# Patient Record
Sex: Female | Born: 1985 | Race: Asian | Hispanic: No | Marital: Married | Smoking: Never smoker
Health system: Southern US, Community
[De-identification: ages and names within clinical notes are randomized; demographics above are authoritative.]

---

## 2004-05-26 ENCOUNTER — Emergency Department (HOSPITAL_COMMUNITY): Admission: EM | Admit: 2004-05-26 | Discharge: 2004-05-27 | Payer: Self-pay | Admitting: Emergency Medicine

## 2005-09-06 ENCOUNTER — Encounter: Admission: RE | Admit: 2005-09-06 | Discharge: 2005-09-06 | Payer: Self-pay | Admitting: Occupational Medicine

## 2006-06-14 ENCOUNTER — Encounter: Admission: RE | Admit: 2006-06-14 | Discharge: 2006-06-14 | Payer: Self-pay | Admitting: Family Medicine

## 2008-03-06 ENCOUNTER — Ambulatory Visit (HOSPITAL_COMMUNITY): Admission: RE | Admit: 2008-03-06 | Discharge: 2008-03-06 | Payer: Self-pay | Admitting: Emergency Medicine

## 2009-08-06 ENCOUNTER — Inpatient Hospital Stay (HOSPITAL_COMMUNITY): Admission: AD | Admit: 2009-08-06 | Discharge: 2009-08-08 | Payer: Self-pay | Admitting: Obstetrics

## 2009-08-09 ENCOUNTER — Encounter: Admission: RE | Admit: 2009-08-09 | Discharge: 2009-08-23 | Payer: Self-pay | Admitting: Obstetrics

## 2009-11-16 ENCOUNTER — Encounter: Admission: RE | Admit: 2009-11-16 | Discharge: 2009-11-24 | Payer: Self-pay | Admitting: Obstetrics

## 2010-01-10 IMAGING — US US PELVIS COMPLETE MODIFY
1 series · 14 of 25 positions shown · non-contrast
Comparison: None

CLINICAL DATA: Back pain for 2 months with intermittent abdominal
pain for 1 month and LMP 02/25/2008

TRANSABDOMINAL AND TRANSVAGINAL ULTRASOUND OF PELVIS
TECHNIQUE: Both transabdominal and transvaginal ultrasound
examinations of the pelvis were performed including evaluation of
the uterus, ovaries, adnexal regions, and pelvic cul-de-sac.

[Series 1: us transvaginal non-ob · 14 of 38 slices shown]
[im 1/38]
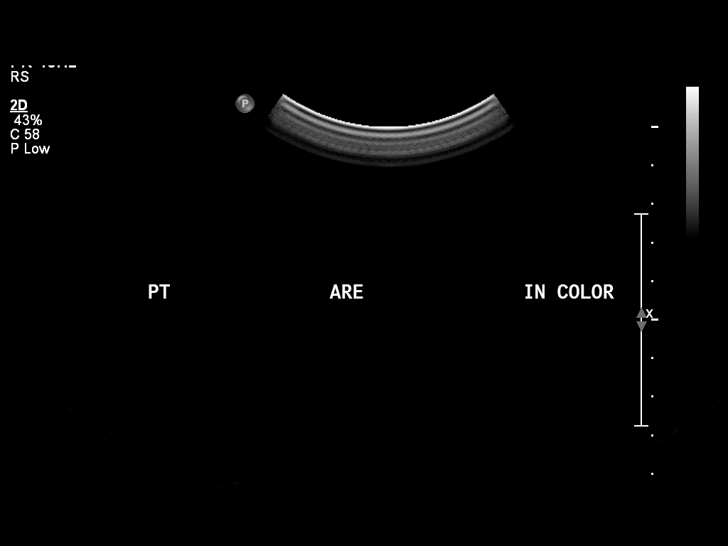
[im 4/38]
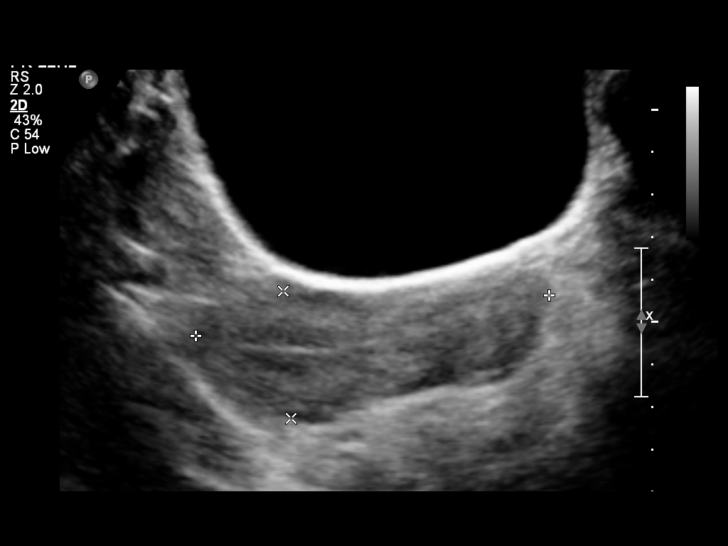
[im 7/38]
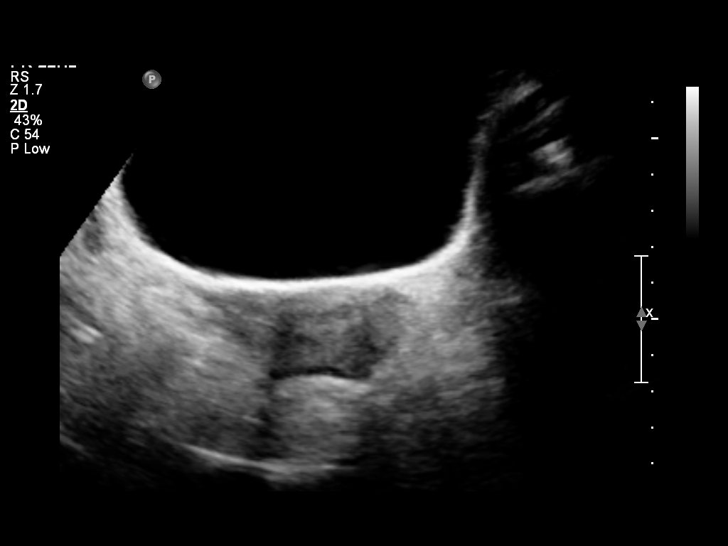
[im 10/38]
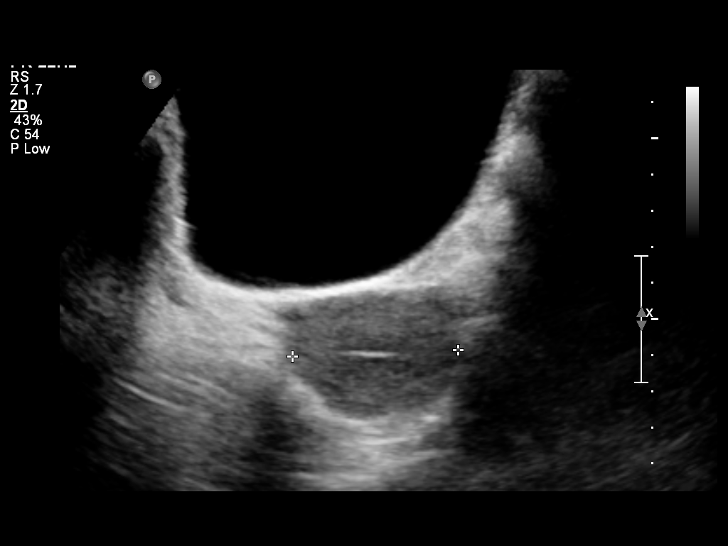
[im 13/38]
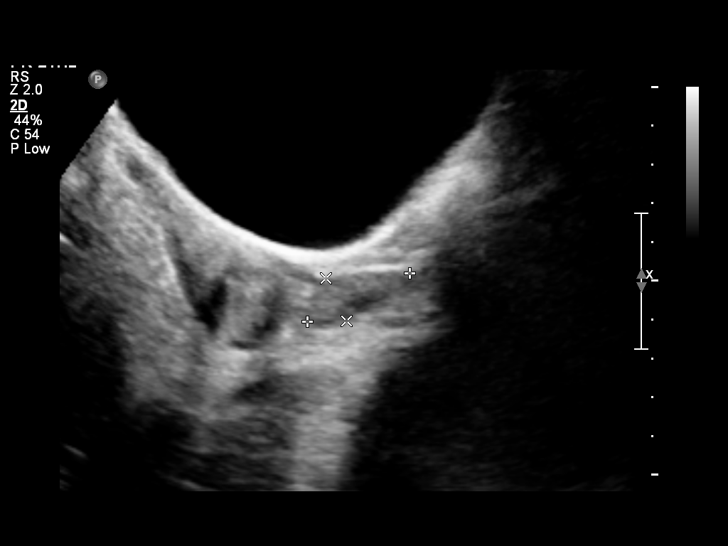
[im 14/38]
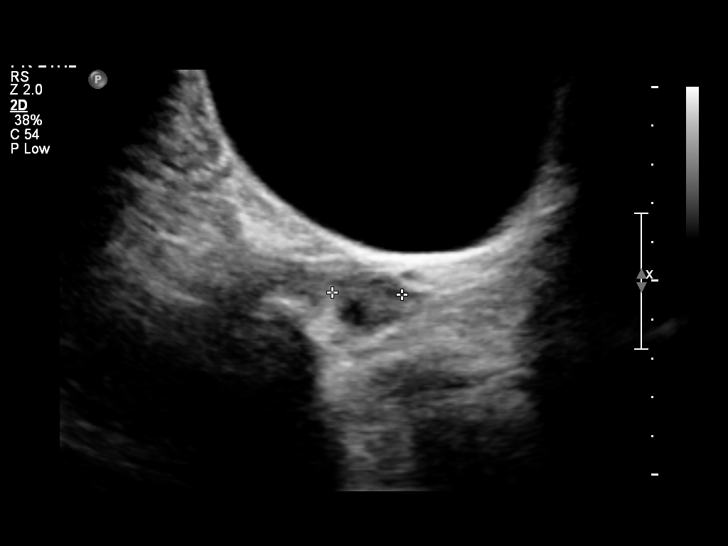
[im 17/38]
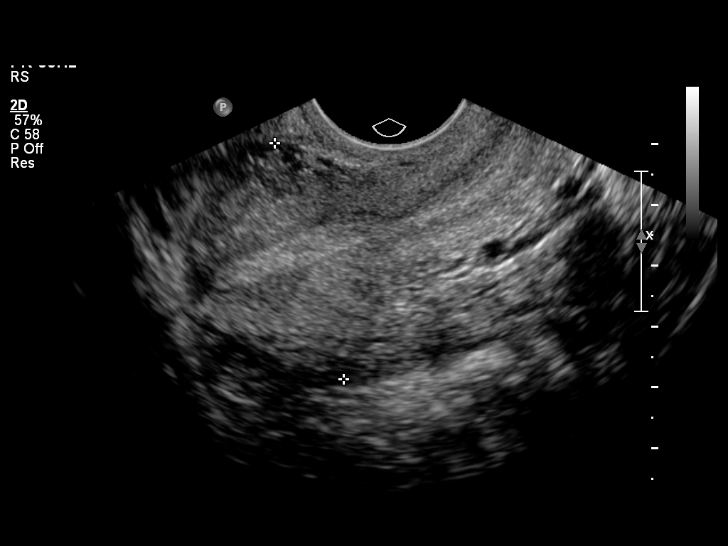
[im 21/38]
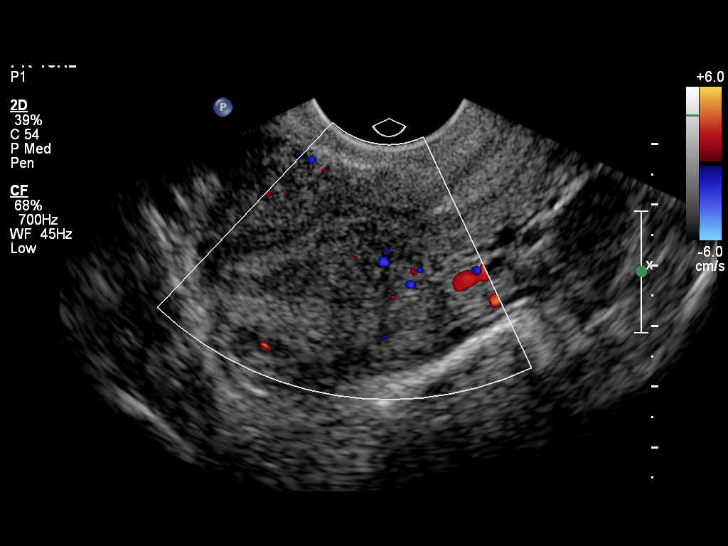
[im 24/38]
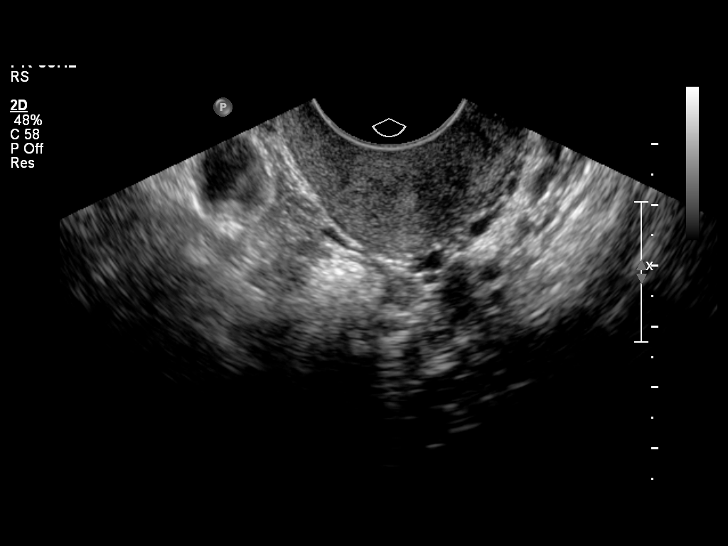
[im 25/38]
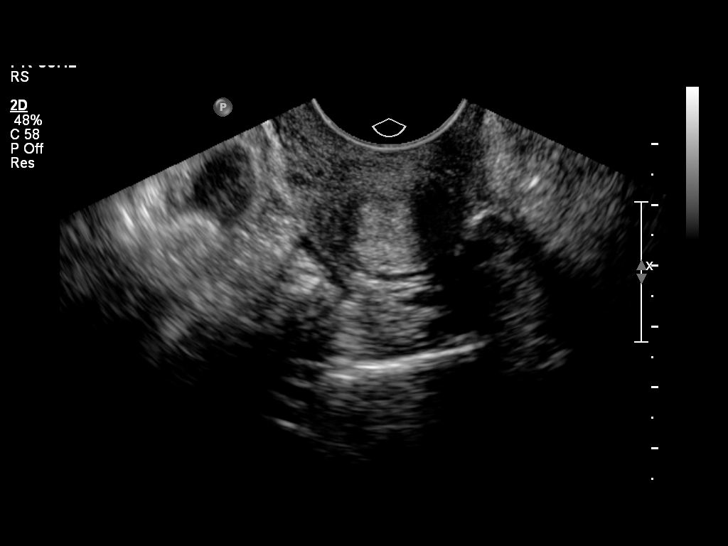
[im 28/38]
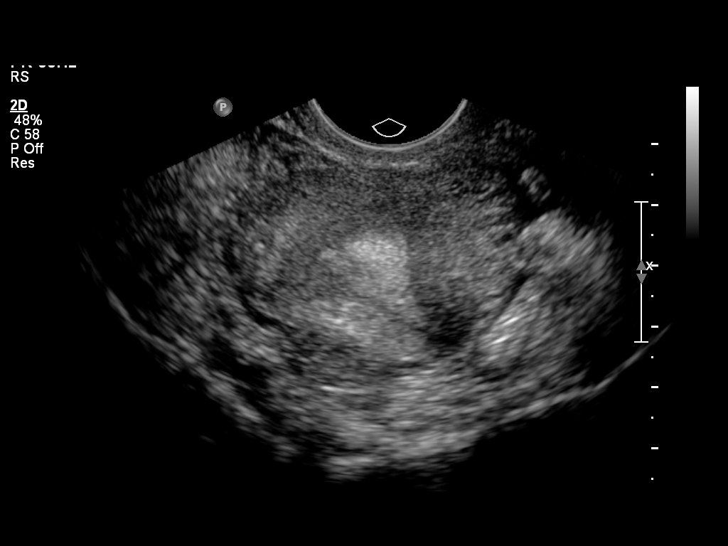
[im 31/38]
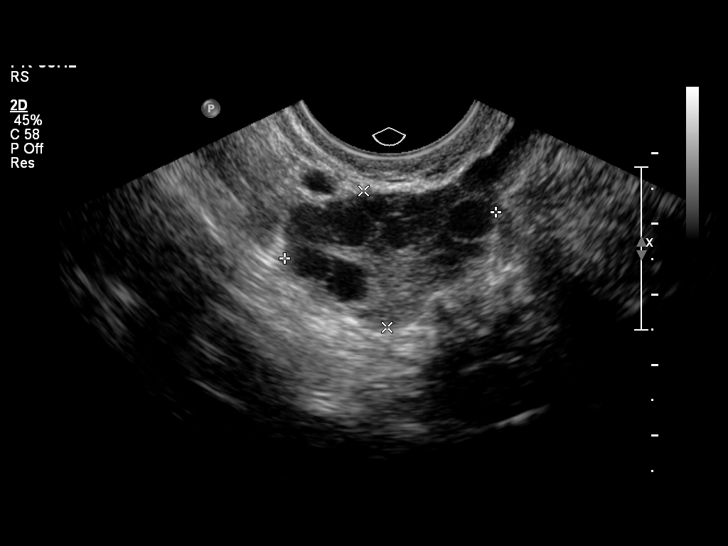
[im 34/38]
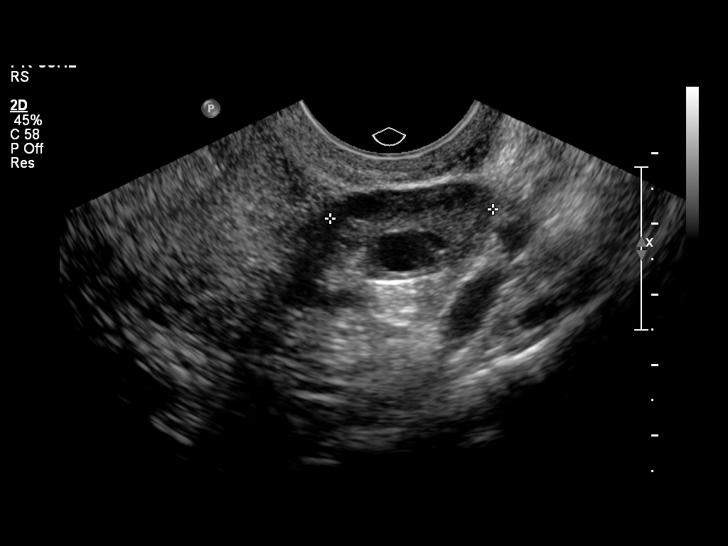
[im 38/38]
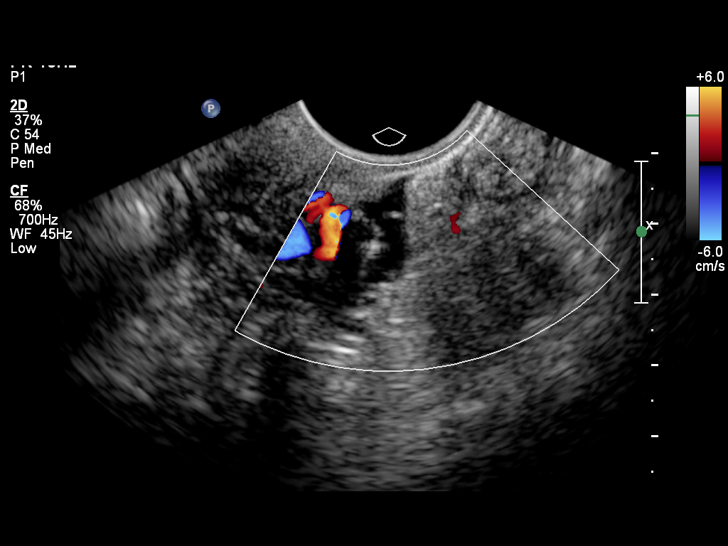

[14 of 25 positions shown; findings below may reference images not displayed]

FINDINGS: The uterus has a sagittal length of 8.3 cm, an AP width
of 3.0 cm and a transverse width of 4.6 cm.  A homogeneous uterine
myometrium is seen.  The endometrial lining is echogenic with an AP
width of 6 mm.  No areas of focal thickening or inhomogeneity are
noted.

Both ovaries have a normal appearance with the right ovary
measuring 1.9 x 3.1 x 2.0 cm and the left ovary measuring 3.1 x
x 2.3 cm.  No cul-de-sac or periovarian fluid is seen and no
separate adnexal masses are noted.
IMPRESSION: Normal pelvic ultrasound.

## 2010-06-01 LAB — CBC
HCT: 36.4 % (ref 36.0–46.0)
Hemoglobin: 12.7 g/dL (ref 12.0–15.0)
MCHC: 34.8 g/dL (ref 30.0–36.0)
MCV: 90 fL (ref 78.0–100.0)
Platelets: 206 10*3/uL (ref 150–400)
RDW: 15.7 % — ABNORMAL HIGH (ref 11.5–15.5)
WBC: 12.5 10*3/uL — ABNORMAL HIGH (ref 4.0–10.5)

## 2010-06-01 LAB — RPR: RPR Ser Ql: NONREACTIVE

## 2011-06-12 IMAGING — US US OB LIMITED
1 series · 14 of 17 positions shown · non-contrast
Comparison: none

OBSTETRICAL ULTRASOUND:
 This ultrasound exam was performed in the [HOSPITAL] Ultrasound Department.  The OB US report was generated in the AS system, and faxed to the ordering physician.  This report is also available in [HOSPITAL]?s Access[employer]ware and in [REDACTED] PACS.

[Series 1: us ob follow up · 0.25mm/px · 14 of 17 slices shown]
[im 1/17]
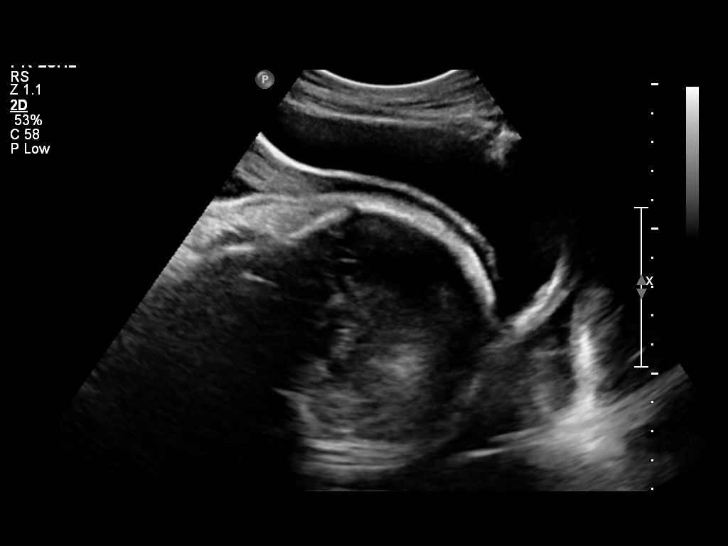
[im 2/17]
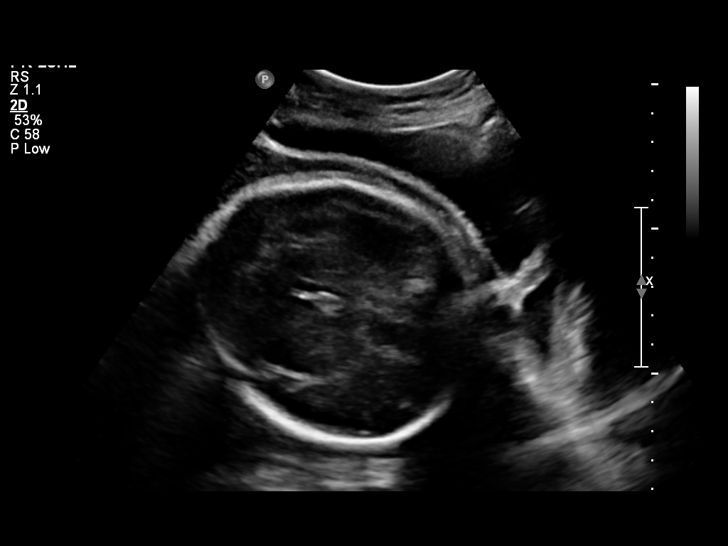
[im 4/17]
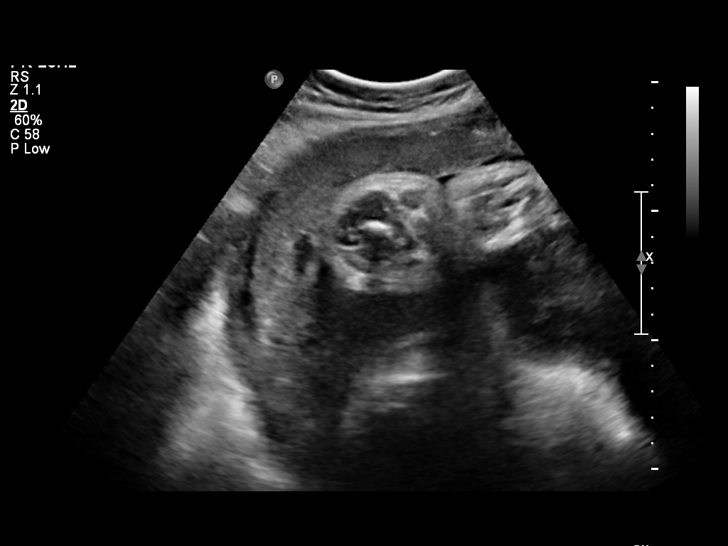
[im 5/17]
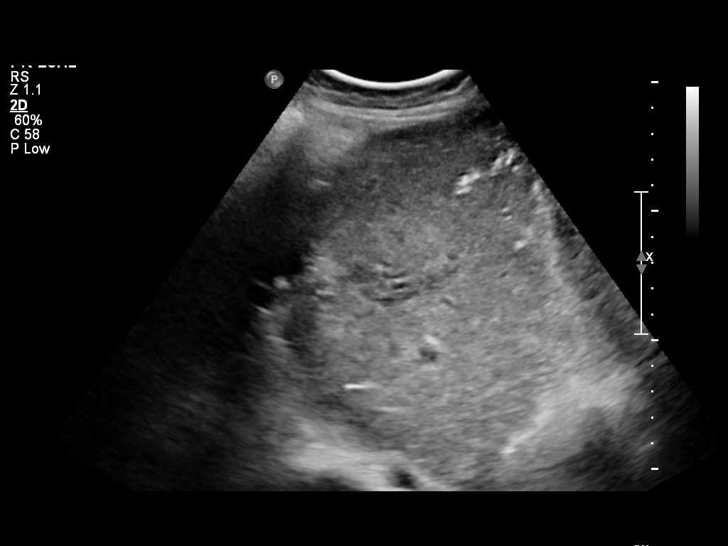
[im 6/17]
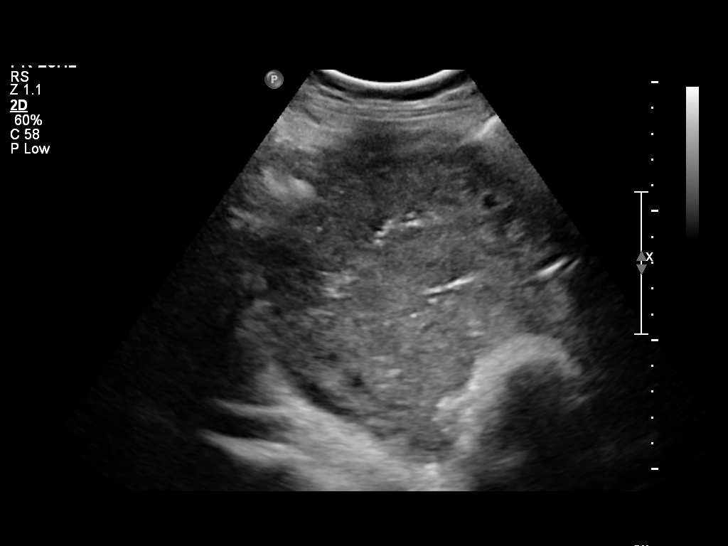
[im 7/17]
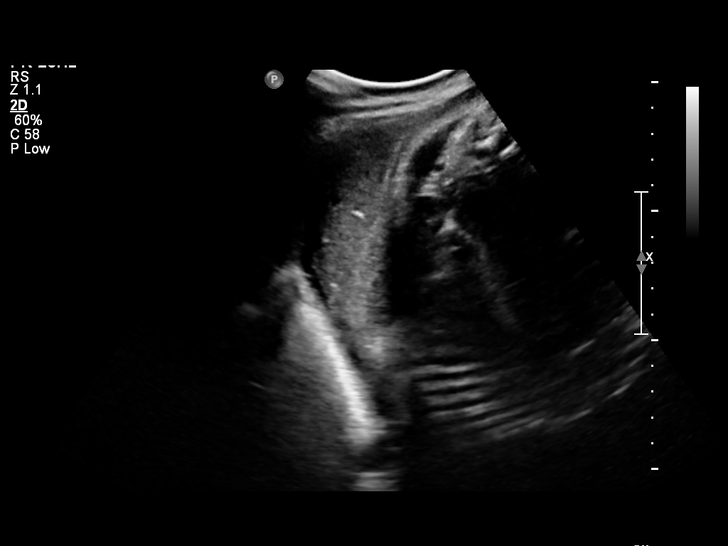
[im 8/17]
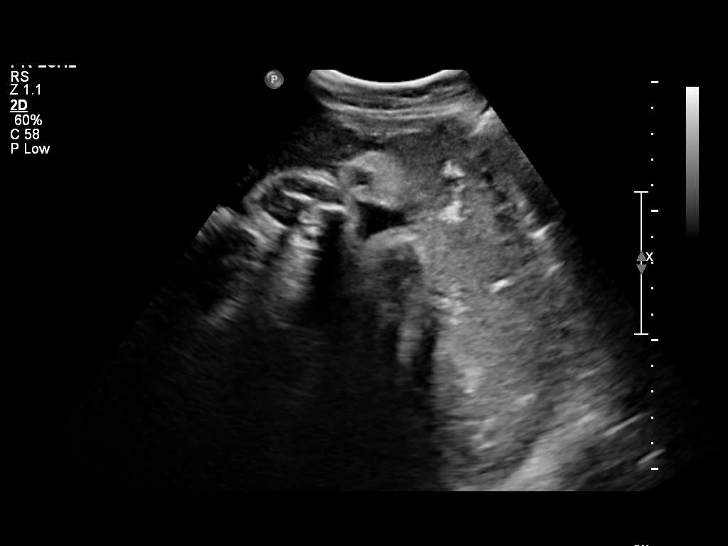
[im 10/17]
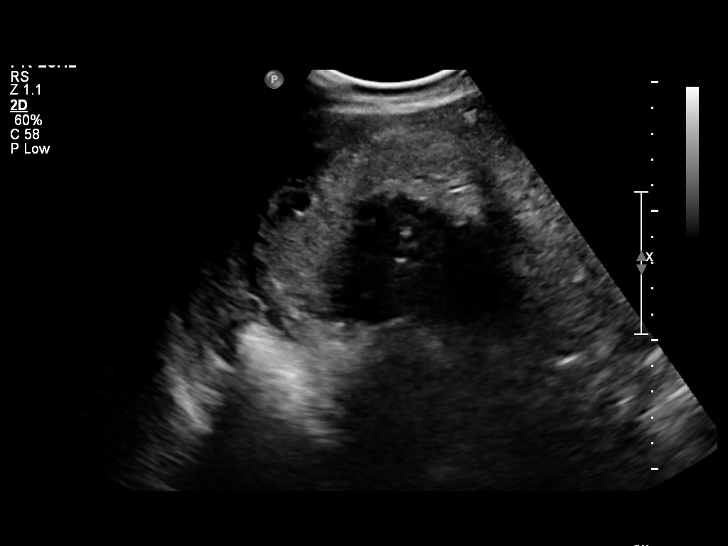
[im 11/17]
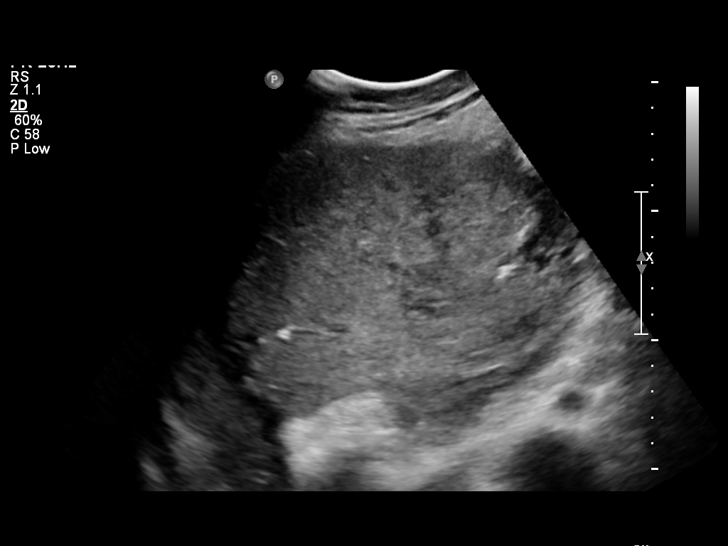
[im 12/17]
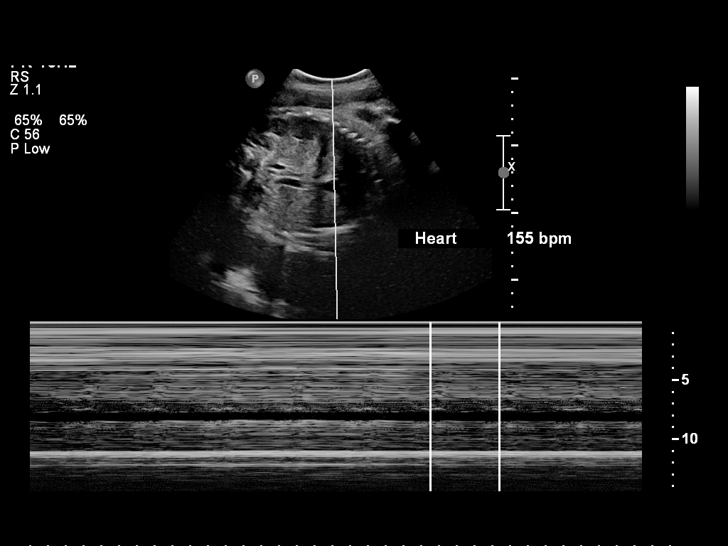
[im 13/17]
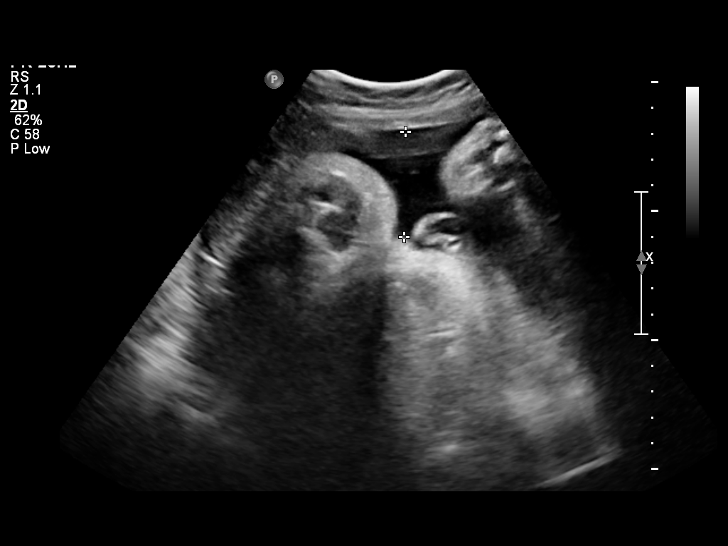
[im 14/17]
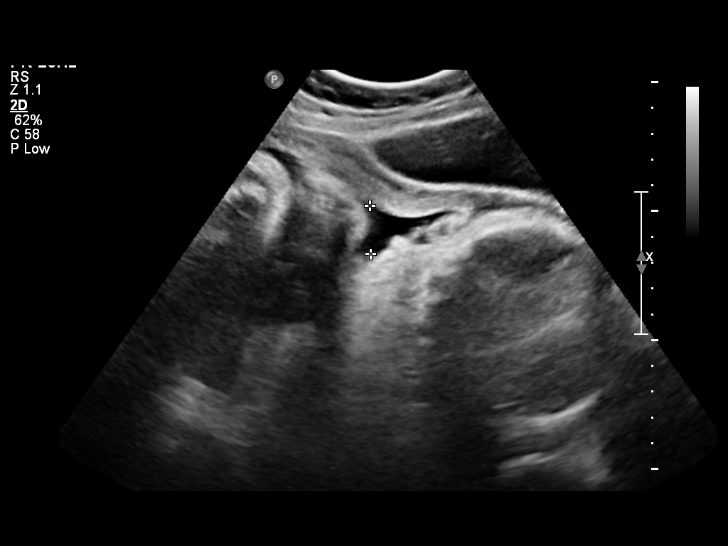
[im 16/17]
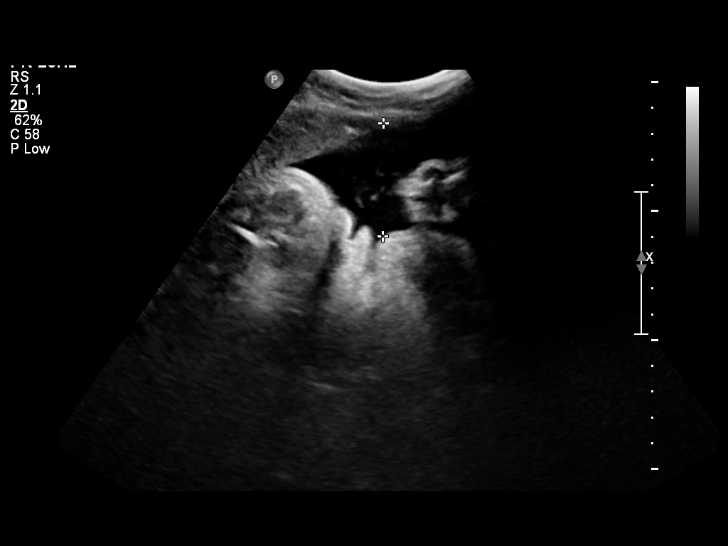
[im 17/17]
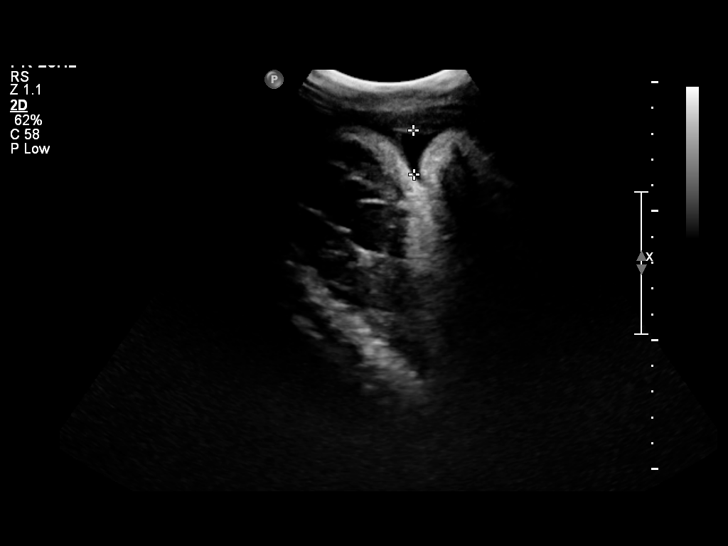

[14 of 17 positions shown; findings below may reference images not displayed]

IMPRESSION: See AS Obstetric US report.

## 2013-08-31 ENCOUNTER — Encounter: Payer: Self-pay | Admitting: Gynecology

## 2013-09-25 ENCOUNTER — Encounter: Payer: Self-pay | Admitting: Physician Assistant

## 2013-09-25 ENCOUNTER — Ambulatory Visit (INDEPENDENT_AMBULATORY_CARE_PROVIDER_SITE_OTHER): Payer: BC Managed Care – PPO

## 2013-09-25 ENCOUNTER — Ambulatory Visit (INDEPENDENT_AMBULATORY_CARE_PROVIDER_SITE_OTHER): Payer: BC Managed Care – PPO | Admitting: Internal Medicine

## 2013-09-25 VITALS — BP 126/80 | HR 85 | Temp 99.0°F | Resp 17 | Ht 58.5 in | Wt 104.0 lb

## 2013-09-25 DIAGNOSIS — Z111 Encounter for screening for respiratory tuberculosis: Secondary | ICD-10-CM

## 2013-09-25 DIAGNOSIS — Z23 Encounter for immunization: Secondary | ICD-10-CM

## 2013-09-25 DIAGNOSIS — Z0000 Encounter for general adult medical examination without abnormal findings: Secondary | ICD-10-CM

## 2013-09-25 NOTE — Patient Instructions (Signed)

## 2013-09-25 NOTE — Progress Notes (Signed)
Subjective:    Patient ID: Dana Myers, female    DOB: [birth year], 29 y.o.   MRN: [id number]  HPI   Dana Myers is a very pleasant 28 yr old female here for CPE  Complaints:  None LMP:  09/19/13, regular every month Contraception:  none GYN:  Last pap 4 yrs ago normal; appointment Aug 3rd with GYN for pap Sexually active, no concern for STI Dentist:  No dentist Eye doctor:  Contacts and glasses - every year Imm:  Will update today for school Diet:  Varied; limits fast food and junk food; no soda; mostly water to drink Exercise:  Strength training, running; 3x/wk Meds:  none Family history:  Father passed away from MI at 68 Tobacco: none Etoh:  none   [employer] - accounting, 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  Eyes: Negative for visual disturbance.  Respiratory: Negative for cough, shortness of breath and wheezing.   Cardiovascular: Negative for chest pain.  Gastrointestinal: Negative for nausea, vomiting and abdominal pain.  Genitourinary: Negative for dysuria, vaginal bleeding and vaginal discharge.  Musculoskeletal: Negative for arthralgias and myalgias.  Skin: Negative.   Neurological: Negative.        Objective:   Physical Exam  Vitals reviewed. Constitutional: She is oriented to person, place, and time. She appears well-developed and well-nourished. No distress.  HENT:  Head: Normocephalic and atraumatic.  Right Ear: Tympanic membrane and ear canal normal.  Left Ear: Tympanic membrane and ear canal normal.  Mouth/Throat: Uvula is midline, oropharynx is clear and moist and mucous membranes are normal.  Eyes: Conjunctivae are normal. No scleral icterus.  Neck: Neck supple.  Cardiovascular: Normal rate, regular rhythm and normal heart sounds.   Pulmonary/Chest: Effort normal and breath sounds normal. She has no wheezes. She has no rales.  Abdominal: Soft. Bowel sounds are normal. There is no  tenderness.  Lymphadenopathy:    She has no cervical adenopathy.  Neurological: She is alert and oriented to person, place, and time.  Skin: Skin is warm and dry.  Psychiatric: She has a normal mood and affect. Her behavior is normal.    [facility] reading (PRIMARY) by  Dr. 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   Immunization History  Administered Date(s) Administered  . DTaP 05/13/2001, 06/15/2001, 07/17/2001, 08/17/2001, 09/16/2001  . Hepatitis B 04/29/2000, 05/28/2000, 06/30/2000  . IPV 03/24/2002  . MMR 02/20/2002, 09/25/2013  . Tdap 09/25/2013       Assessment & Plan:  Routine general medical examination at a health care facility  Screening for tuberculosis - Plan: DG Chest 1 View  Need for Tdap vaccination - Plan: Tdap vaccine greater than or equal to 7yo IM  Need for MMR vaccine - Plan: MMR vaccine subcutaneous   Dana Myers is a very pleasant 28 yr female here for CPE.  She is in good health and exam is normal.  She has an upcoming appt with GYN so have deferred pap and pelvic today.  To be compliant with school immunizations requirements she needs Tdap and MMR today which were both administered.  Due to hx BCG we have done a 1 view CXR for TB screening - which is negative.  She will not need to repeat CXR for 10 years as long as she is asymptomatic.  PPW completed and copies sent for scanning    E. Natividad Brood  MHS, PA-C [facility] 7/14/20159:37 PM

## 2013-10-08 ENCOUNTER — Encounter: Payer: Self-pay | Admitting: Gynecology

## 2013-10-15 ENCOUNTER — Encounter: Payer: Self-pay | Admitting: Gynecology

## 2013-10-15 ENCOUNTER — Ambulatory Visit (INDEPENDENT_AMBULATORY_CARE_PROVIDER_SITE_OTHER): Payer: BC Managed Care – PPO | Admitting: Gynecology

## 2013-10-15 VITALS — BP 98/60 | HR 80 | Resp 18 | Ht <= 58 in | Wt 105.0 lb

## 2013-10-15 DIAGNOSIS — Z124 Encounter for screening for malignant neoplasm of cervix: Secondary | ICD-10-CM

## 2013-10-15 DIAGNOSIS — Z659 Problem related to unspecified psychosocial circumstances: Secondary | ICD-10-CM

## 2013-10-15 DIAGNOSIS — F4323 Adjustment disorder with mixed anxiety and depressed mood: Secondary | ICD-10-CM

## 2013-10-15 DIAGNOSIS — Z658 Other specified problems related to psychosocial circumstances: Secondary | ICD-10-CM

## 2013-10-15 DIAGNOSIS — Z3009 Encounter for other general counseling and advice on contraception: Secondary | ICD-10-CM

## 2013-10-15 DIAGNOSIS — Z01419 Encounter for gynecological examination (general) (routine) without abnormal findings: Secondary | ICD-10-CM

## 2013-10-15 MED ORDER — PAROXETINE HCL 10 MG PO TABS: 10.0000 mg | ORAL_TABLET | ORAL | Status: DC

## 2013-10-15 NOTE — Progress Notes (Addendum)
28 y.o. Single [place] female   G1P1001 here for annual exam. Pt is  currently sexually active.  She reports  using condoms on a regular basis.  First sexual activity at 28  years old, 1  number of lifetime partners.  Together for [employer]y.  Pt reports cycles are regular but first day can be heavy, changes large pads every 2h, no clots.  Patient's last menstrual period was 09/19/2013.          Sexually active: Yes.    The current method of family planning is condoms majority of the time.    Exercising: Yes.    cardio, squats 3x/wk Last pap: 4 years ago- wnl Alcohol: no Tobacco: no Drugs: no Gardisil: no, completed:   Health Maintenance  Topic Date Due  . Pap Smear  09/06/2002  . Influenza Vaccine  10/13/2013  . Tetanus/tdap  09/26/2023    Family History  Problem Relation Age of Onset  . Heart disease Father   . Throat cancer Maternal Grandfather   . Diabetes Maternal Uncle     Uncle's on mom's side  . Hypertension Father     There are no active problems to display for this patient.   History reviewed. No pertinent past medical history.  History reviewed. No pertinent past surgical history.  Allergies: Review of patient's allergies indicates no known allergies.  No current outpatient prescriptions on file.   No current facility-administered medications for this visit.    ROS: Pertinent items are noted in HPI.  Exam:    LMP 09/19/2013 Weight change: @WEIGHTCHANGE @ Last 3 height recordings:  Ht Readings from Last 3 Encounters:  09/25/13 4' 10.5" (1.486 m)   General appearance: alert, cooperative and appears stated age Head: Normocephalic, without obvious abnormality, atraumatic Neck: no adenopathy, no carotid bruit, no JVD, supple, symmetrical, trachea midline and thyroid not enlarged, symmetric, no tenderness/mass/nodules Lungs: clear to auscultation bilaterally Breasts: normal appearance, no masses or tenderness Heart: regular rate and rhythm, S1, S2 normal, no  murmur, click, rub or gallop Abdomen: soft, non-tender; bowel sounds normal; no masses,  no organomegaly Extremities: extremities normal, atraumatic, no cyanosis or edema Skin: Skin color, texture, turgor normal. No rashes or lesions Lymph nodes: Cervical, supraclavicular, and axillary nodes normal. no inguinal nodes palpated Neurologic: Grossly normal   Pelvic: External genitalia:  no lesions              Urethra: normal appearing urethra with no masses, tenderness or lesions              Bartholins and Skenes: Bartholin's, Urethra, Skene's normal                 Vagina: normal appearing vagina with normal color and discharge, no lesions              Cervix: normal appearance              Pap taken: Yes.          Bimanual Exam:  Uterus:  uterus is normal size, shape, consistency and nontender                                      Adnexa:    normal adnexa in size, nontender and no masses  Rectovaginal: Confirms                                      Anus:  normal sphincter tone, no lesions      1. Routine gynecological examination counseled on breast self exam, mammography screening, adequate intake of calcium and vitamin D, diet and exercise return annually or prn  2. Screening for cervical cancer Guidelines reviewed - PAP with Reflex to HPV (IPS)  3. Other general counseling and advice for contraceptive management Ocp, paraguard, mirena and continued condom use reviewed.  Pros and cons of each including affects on menses - IUD Insertion; Future  4. Other social stressor/anxiety-depression Pt tearful.  Pt is [place] and in [place] for 10y.  Pt is Hindu by birth but converted to Muslim to marry husband-no civil documents.  Pt lives in traditional [place] household with her in laws and her brother and sister in Social worker.  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  Had a  temporary separation from husband due to these issues.  She has support from her family.  Suggest she seek advise from family and her religious leader. Pt requests rx to help.   - PARoxetine (PAXIL) 10 MG tablet; Take 1 tablet (10 mg total) by mouth every morning.  Dispense: 30 tablet; Refill: 12 43m spent counseling re social stressor   An After Visit Summary was printed and given to the patient.

## 2013-10-16 LAB — IPS PAP TEST WITH REFLEX TO HPV

## 2013-10-18 ENCOUNTER — Telehealth: Payer: Self-pay | Admitting: *Deleted

## 2013-10-18 NOTE — Telephone Encounter (Signed)
Message copied by Luisa DagoPHILLIPS, STEPHANIE C on Thu Oct 18, 2013 10:52 AM ------      Message from: Douglass RiversLATHROP, TRACY      Created: Wed Oct 17, 2013  7:00 PM       Inform BV on pap, would suggest she treat if she is considering IUD-tindamax 500mg   4 tab at once repeat next day #8 ------

## 2013-10-18 NOTE — Telephone Encounter (Signed)
I have attempted to contact this patient by phone with the following results: left message to return my call on answering machine (mobile per DPR).[phone] (Mobile)

## 2013-10-19 MED ORDER — TINIDAZOLE 500 MG PO TABS: ORAL_TABLET | ORAL | Status: DC

## 2013-10-19 NOTE — Telephone Encounter (Signed)
Returning a call to Stephanie. °

## 2013-10-19 NOTE — Telephone Encounter (Signed)
Patient returning Stephanie's call.

## 2013-10-19 NOTE — Telephone Encounter (Addendum)
Notified of results and escribed Tindmax to [facility]. Pt would like to proceed with IUD insertion.  Informed pt I would make sure we follow through with scheduling that for her.

## 2013-10-29 ENCOUNTER — Telehealth: Payer: Self-pay | Admitting: Gynecology

## 2013-10-29 NOTE — Telephone Encounter (Signed)
Left message for patient to call back. Need to go over contraception benefits. °

## 2013-11-12 NOTE — Addendum Note (Signed)
Addended by: Douglass Rivers on: 11/12/2013 04:59 PM   Modules accepted: Level of Service

## 2014-01-14 ENCOUNTER — Encounter: Payer: Self-pay | Admitting: Gynecology

## 2014-04-30 ENCOUNTER — Emergency Department (HOSPITAL_BASED_OUTPATIENT_CLINIC_OR_DEPARTMENT_OTHER)
Admission: EM | Admit: 2014-04-30 | Discharge: 2014-05-01 | Disposition: A | Discharge status: Other Acute Inpatient Hospital | Payer: 59 | Attending: Emergency Medicine | Admitting: Emergency Medicine

## 2014-04-30 ENCOUNTER — Encounter (HOSPITAL_BASED_OUTPATIENT_CLINIC_OR_DEPARTMENT_OTHER): Payer: Self-pay

## 2014-04-30 DIAGNOSIS — Z3202 Encounter for pregnancy test, result negative: Secondary | ICD-10-CM | POA: Diagnosis not present

## 2014-04-30 DIAGNOSIS — Z79899 Other long term (current) drug therapy: Secondary | ICD-10-CM | POA: Diagnosis not present

## 2014-04-30 DIAGNOSIS — Z008 Encounter for other general examination: Secondary | ICD-10-CM

## 2014-04-30 DIAGNOSIS — T50902A Poisoning by unspecified drugs, medicaments and biological substances, intentional self-harm, initial encounter: Secondary | ICD-10-CM

## 2014-04-30 DIAGNOSIS — T39312A Poisoning by propionic acid derivatives, intentional self-harm, initial encounter: Secondary | ICD-10-CM | POA: Insufficient documentation

## 2014-04-30 LAB — COMPREHENSIVE METABOLIC PANEL
ALBUMIN: 4.2 g/dL (ref 3.5–5.2)
ALK PHOS: 44 U/L (ref 39–117)
ALT: 12 U/L (ref 0–35)
AST: 26 U/L (ref 0–37)
Anion gap: 11 (ref 5–15)
BUN: 17 mg/dL (ref 6–23)
CO2: 19 mmol/L (ref 19–32)
Calcium: 8.4 mg/dL (ref 8.4–10.5)
Chloride: 106 mmol/L (ref 96–112)
Creatinine, Ser: 0.57 mg/dL (ref 0.50–1.10)
GFR calc non Af Amer: >90 mL/min (ref >90)
GLUCOSE: 199 mg/dL — AB (ref 70–99)
POTASSIUM: 3.2 mmol/L — AB (ref 3.5–5.1)
SODIUM: 136 mmol/L (ref 135–145)
TOTAL PROTEIN: 7.8 g/dL (ref 6.0–8.3)
Total Bilirubin: 3.2 mg/dL — ABNORMAL HIGH (ref 0.3–1.2)

## 2014-04-30 LAB — RAPID URINE DRUG SCREEN, HOSP PERFORMED
AMPHETAMINES: NOT DETECTED
Barbiturates: NOT DETECTED
Benzodiazepines: NOT DETECTED
COCAINE: NOT DETECTED
OPIATES: NOT DETECTED
Tetrahydrocannabinol: NOT DETECTED

## 2014-04-30 LAB — CBC WITH DIFFERENTIAL/PLATELET
BASOS PCT: 0 % (ref 0–1)
Basophils Absolute: 0 10*3/uL (ref 0.0–0.1)
EOS ABS: 0.3 10*3/uL (ref 0.0–0.7)
Eosinophils Relative: 1 % (ref 0–5)
HEMATOCRIT: 36.2 % (ref 36.0–46.0)
Hemoglobin: 12.6 g/dL (ref 12.0–15.0)
LYMPHS ABS: 4.2 10*3/uL — AB (ref 0.7–4.0)
Lymphocytes Relative: 20 % (ref 12–46)
MCH: 29.3 pg (ref 26.0–34.0)
MCHC: 34.8 g/dL (ref 30.0–36.0)
MCV: 84.2 fL (ref 78.0–100.0)
MONO ABS: 1.3 10*3/uL — AB (ref 0.1–1.0)
MONOS PCT: 6 % (ref 3–12)
NEUTROS ABS: 15.6 10*3/uL — AB (ref 1.7–7.7)
NEUTROS PCT: 73 % (ref 43–77)
Platelets: 213 10*3/uL (ref 150–400)
RBC: 4.3 MIL/uL (ref 3.87–5.11)
RDW: 13.3 % (ref 11.5–15.5)
WBC: 21.3 10*3/uL — AB (ref 4.0–10.5)

## 2014-04-30 LAB — URINALYSIS, ROUTINE W REFLEX MICROSCOPIC
Bilirubin Urine: NEGATIVE
GLUCOSE, UA: NEGATIVE mg/dL
Hgb urine dipstick: NEGATIVE
LEUKOCYTES UA: NEGATIVE
NITRITE: NEGATIVE
PROTEIN: NEGATIVE mg/dL
Specific Gravity, Urine: 1.037 — ABNORMAL HIGH (ref 1.005–1.030)
UROBILINOGEN UA: 1 mg/dL (ref 0.0–1.0)
pH: 5 (ref 5.0–8.0)

## 2014-04-30 LAB — CBG MONITORING, ED: GLUCOSE-CAPILLARY: 165 mg/dL — AB (ref 70–99)

## 2014-04-30 LAB — ACETAMINOPHEN LEVEL: Acetaminophen (Tylenol), Serum: <10 ug/mL — ABNORMAL LOW (ref 10–30)

## 2014-04-30 LAB — ETHANOL: Alcohol, Ethyl (B): <5 mg/dL (ref 0–9)

## 2014-04-30 LAB — LIPASE, BLOOD: LIPASE: 48 U/L (ref 11–59)

## 2014-04-30 LAB — SALICYLATE LEVEL: Salicylate Lvl: <4 mg/dL (ref 2.8–20.0)

## 2014-04-30 LAB — PREGNANCY, URINE: PREG TEST UR: NEGATIVE

## 2014-04-30 LAB — TROPONIN I: Troponin I: <0.03 ng/mL (ref <0.031)

## 2014-04-30 MED ORDER — SODIUM CHLORIDE 0.9 % IV BOLUS (SEPSIS)
1000.0000 mL | Freq: Once | INTRAVENOUS | Status: AC
  Administered 2014-04-30: 1000 mL via INTRAVENOUS

## 2014-04-30 MED ORDER — LACTATED RINGERS IV SOLN
INTRAVENOUS | Status: DC
  Administered 2014-04-30: 22:00:00 via INTRAVENOUS

## 2014-04-30 NOTE — ED Notes (Signed)
Pt. Intentionally took aleve due to she said she was mad at her husband.  Pt. Is very sleepy and lethargic.

## 2014-04-30 NOTE — ED Provider Notes (Signed)
CSN: [id number]     Arrival date & time 04/30/14  2029 History  This chart was scribed for Gerhard Munchobert Danijela Vessey, MD by Freida Busmaniana Omoyeni, ED Scribe. This patient was seen in room MHT14/MHT14 and the patient's care was started 9:01 PM.    Chief Complaint  Patient presents with  . Drug Overdose   LEVEL 5 Caveat due to mental status  The history is provided by a significant other (and ED RN). The history is limited by the condition of the patient. No language interpreter was used.     HPI Comments:  Dana Myers is a 29 y.o. female who presents to the Emergency Department for an intentional overdose that occurred this evening. Per ED RN pt intentionally ingested a large amount of aleve after an agrument with her husband. Pt's husband does not believe pt is currently on any other medications but when asked about pt's past use of paxil husband was not aware that she was ever taking paxil.Marland Kitchen.  Unable to fully obtain HPI/ROS due to pt's mental status.   No past medical history on file. No past surgical history on file. Family History  Problem Relation Age of Onset  . Heart disease Father   . Throat cancer Maternal Grandfather   . Diabetes Maternal Uncle     Uncle's on mom's side  . Hypertension Father    History  Substance Use Topics  . Smoking status: Never Smoker   . Smokeless tobacco: Never Used  . Alcohol Use: No   OB History    Gravida Para Term Preterm AB TAB SAB Ectopic Multiple Living   1 1 1       1      Review of Systems  Unable to perform ROS: Mental status change      Allergies  Review of patient's allergies indicates no known allergies.  Home Medications   Prior to Admission medications   Medication Sig Start Date End Date Taking? Authorizing Provider  PARoxetine (PAXIL) 10 MG tablet Take 1 tablet (10 mg total) by mouth every morning. 10/15/13   Douglass Riversracy Lathrop, MD  tinidazole (TINDAMAX) 500 MG tablet 4 tab at once repeat next day 10/19/13   Douglass Riversracy Lathrop, MD   BP 114/69  mmHg  Pulse 84  Temp(Src) 98.1 F (36.7 C) (Oral)  Resp 16  Wt 104 lb (47.174 kg)  SpO2 100% Physical Exam  Constitutional: She appears well-developed and well-nourished. No distress.  HENT:  Head: Normocephalic and atraumatic.  Eyes: Conjunctivae are normal.  Dysconjugate gaze   Cardiovascular: Normal rate and regular rhythm.   Pulmonary/Chest: Effort normal and breath sounds normal. No stridor. No respiratory distress.  Abdominal: She exhibits no distension.  Musculoskeletal: She exhibits no edema.  Neurological: She is alert. No cranial nerve deficit.  Not following commands   Skin: Skin is warm and dry.  No signs of trauma  Psychiatric: She has a normal mood and affect.  Nursing note and vitals reviewed.   ED Course  Procedures  DIAGNOSTIC STUDIES:  Oxygen Saturation is 100% on RA, normal by my interpretation.     Labs Review Labs Reviewed  COMPREHENSIVE METABOLIC PANEL - Abnormal; Notable for the following:    Potassium 3.2 (*)    Glucose, Bld 199 (*)    Total Bilirubin 3.2 (*)    All other components within normal limits  ACETAMINOPHEN LEVEL - Abnormal; Notable for the following:    Acetaminophen (Tylenol), Serum <10.0 (*)    All other components within normal limits  CBC WITH DIFFERENTIAL/PLATELET - Abnormal; Notable for the following:    WBC 21.3 (*)    Neutro Abs 15.6 (*)    Lymphs Abs 4.2 (*)    Monocytes Absolute 1.3 (*)    All other components within normal limits  URINALYSIS, ROUTINE W REFLEX MICROSCOPIC - Abnormal; Notable for the following:    APPearance HAZY (*)    Specific Gravity, Urine 1.037 (*)    Ketones, ur >80 (*)    All other components within normal limits  CBG MONITORING, ED - Abnormal; Notable for the following:    Glucose-Capillary 165 (*)    All other components within normal limits  ETHANOL  LIPASE, BLOOD  SALICYLATE LEVEL  TROPONIN I  PREGNANCY, URINE  URINE RAPID DRUG SCREEN (HOSP PERFORMED)   I discussed the patient's  ingestion with her husband.  Soon after the initial evaluation we discussed the ingestion with poison control. Recommendation was for cardiac monitoring, fluids, pan toxicology labs.   EKG Interpretation   Date/Time:  Tuesday April 30 2014 20:51:30 EST Ventricular Rate:  94 PR Interval:  132 QRS Duration: 88 QT Interval:  368 QTC Calculation: 460 R Axis:   85 Text Interpretation:  Sinus rhythm with sinus arrhythmia with frequent and  consecutive Premature ventricular complexes Abnormal ECG Sinus rhythm  Premature ventricular complexes Abnormal ekg Confirmed by Gerhard Munch   MD [phone]) on 04/30/2014 8:53:05 PM       Update: Patient no longer agitated, but now somnolent.  10:48 PM Patient now awake and alert.  BP remains consistently, mildly low.   Update: Patient appears calm. She is appropriate for a discussion with psychiatry  MDM  Patient presents after intentional ingestion of multiple pills following a disagreement with her family member. Initially the patient is agitated, flailing, minimally interactive. Patient received initial resuscitation with IV fluids, had monitors applied. After discussion with poison control, patient had evaluation, continued to receive resuscitation efforts. Eventually, the patient calmed, awakened. She was borderline hypotensive, though not severely so.   Patient was eventually cleared for psych consult.   CRITICAL CARE Performed by: Gerhard Munch Total critical care time: 40 Critical care time was exclusive of separately billable procedures and treating other patients. Critical care was necessary to treat or prevent imminent or life-threatening deterioration. Critical care was time spent personally by me on the following activities: development of treatment plan with patient and/or surrogate as well as nursing, discussions with consultants, evaluation of patient's response to treatment, examination of patient, obtaining  history from patient or surrogate, ordering and performing treatments and interventions, ordering and review of laboratory studies, ordering and review of radiographic studies, pulse oximetry and re-evaluation of patient's condition.    I personally performed the services described in this documentation, which was scribed in my presence. The recorded information has been reviewed and is accurate.     Gerhard Munch, MD 05/01/14 [phone]

## 2014-04-30 NOTE — ED Notes (Signed)
Pt moved from trauma room 14 to room 12. Husband and friend at the bedside. Drawers and cabinets locked. Security called to wand pt. She remains on the cardiac monitor NSR with continuous Pulse Ox and BP every 15 minutes. She is alert and cooperative at this time. IV intact with LR infusing WO.

## 2014-05-01 ENCOUNTER — Encounter (HOSPITAL_COMMUNITY): Payer: Self-pay | Admitting: *Deleted

## 2014-05-01 ENCOUNTER — Emergency Department (HOSPITAL_BASED_OUTPATIENT_CLINIC_OR_DEPARTMENT_OTHER): Payer: 59

## 2014-05-01 ENCOUNTER — Inpatient Hospital Stay (HOSPITAL_COMMUNITY)
Admission: AD | Admit: 2014-05-01 | Discharge: 2014-05-02 | DRG: 882 | Disposition: A | Discharge status: Home / Self Care | Payer: 59 | Source: Intra-hospital | Attending: Psychiatry | Admitting: Psychiatry

## 2014-05-01 DIAGNOSIS — Z808 Family history of malignant neoplasm of other organs or systems: Secondary | ICD-10-CM

## 2014-05-01 DIAGNOSIS — T50902A Poisoning by unspecified drugs, medicaments and biological substances, intentional self-harm, initial encounter: Secondary | ICD-10-CM | POA: Insufficient documentation

## 2014-05-01 DIAGNOSIS — Z8249 Family history of ischemic heart disease and other diseases of the circulatory system: Secondary | ICD-10-CM

## 2014-05-01 DIAGNOSIS — F4323 Adjustment disorder with mixed anxiety and depressed mood: Secondary | ICD-10-CM | POA: Diagnosis present

## 2014-05-01 DIAGNOSIS — T39312A Poisoning by propionic acid derivatives, intentional self-harm, initial encounter: Secondary | ICD-10-CM | POA: Diagnosis not present

## 2014-05-01 DIAGNOSIS — Z833 Family history of diabetes mellitus: Secondary | ICD-10-CM | POA: Diagnosis not present

## 2014-05-01 DIAGNOSIS — F419 Anxiety disorder, unspecified: Secondary | ICD-10-CM | POA: Diagnosis present

## 2014-05-01 LAB — BASIC METABOLIC PANEL
ANION GAP: 11 (ref 5–15)
BUN: 10 mg/dL (ref 6–23)
CALCIUM: 8.2 mg/dL — AB (ref 8.4–10.5)
CO2: 21 mmol/L (ref 19–32)
CREATININE: 0.43 mg/dL — AB (ref 0.50–1.10)
Chloride: 104 mmol/L (ref 96–112)
Glucose, Bld: 94 mg/dL (ref 70–99)
Potassium: 3 mmol/L — ABNORMAL LOW (ref 3.5–5.1)
Sodium: 136 mmol/L (ref 135–145)

## 2014-05-01 LAB — CBC WITH DIFFERENTIAL/PLATELET
BASOS PCT: 0 % (ref 0–1)
Basophils Absolute: 0 10*3/uL (ref 0.0–0.1)
EOS ABS: 0 10*3/uL (ref 0.0–0.7)
Eosinophils Relative: 0 % (ref 0–5)
HCT: 32.4 % — ABNORMAL LOW (ref 36.0–46.0)
HEMOGLOBIN: 11.2 g/dL — AB (ref 12.0–15.0)
LYMPHS ABS: 0.9 10*3/uL (ref 0.7–4.0)
Lymphocytes Relative: 4 % — ABNORMAL LOW (ref 12–46)
MCH: 29 pg (ref 26.0–34.0)
MCHC: 34.6 g/dL (ref 30.0–36.0)
MCV: 83.9 fL (ref 78.0–100.0)
MONO ABS: 0.5 10*3/uL (ref 0.1–1.0)
MONOS PCT: 2 % — AB (ref 3–12)
Neutro Abs: 20.6 10*3/uL — ABNORMAL HIGH (ref 1.7–7.7)
Neutrophils Relative %: 94 % — ABNORMAL HIGH (ref 43–77)
Platelets: 192 10*3/uL (ref 150–400)
RBC: 3.86 MIL/uL — ABNORMAL LOW (ref 3.87–5.11)
RDW: 13.2 % (ref 11.5–15.5)
WBC: 22 10*3/uL — ABNORMAL HIGH (ref 4.0–10.5)

## 2014-05-01 MED ORDER — MAGNESIUM HYDROXIDE 400 MG/5ML PO SUSP: 30.0000 mL | Freq: Every day | ORAL | Status: DC | PRN

## 2014-05-01 MED ORDER — PAROXETINE HCL 10 MG PO TABS
10.0000 mg | ORAL_TABLET | ORAL | Status: DC
  Filled 2014-05-01: qty 1

## 2014-05-01 MED ORDER — ALUM & MAG HYDROXIDE-SIMETH 200-200-20 MG/5ML PO SUSP: 30.0000 mL | ORAL | Status: DC | PRN

## 2014-05-01 MED ORDER — LORAZEPAM 1 MG PO TABS: 1.0000 mg | ORAL_TABLET | Freq: Three times a day (TID) | ORAL | Status: DC | PRN

## 2014-05-01 MED ORDER — ONDANSETRON HCL 4 MG/2ML IJ SOLN
INTRAMUSCULAR | Status: AC
  Filled 2014-05-01: qty 2

## 2014-05-01 MED ORDER — ACETAMINOPHEN 325 MG PO TABS: 650.0000 mg | ORAL_TABLET | Freq: Four times a day (QID) | ORAL | Status: DC | PRN

## 2014-05-01 MED ORDER — SODIUM CHLORIDE 0.9 % IV SOLN
Freq: Once | INTRAVENOUS | Status: AC
  Administered 2014-05-01: 02:00:00 via INTRAVENOUS

## 2014-05-01 MED ORDER — ONDANSETRON HCL 4 MG/2ML IJ SOLN
4.0000 mg | Freq: Once | INTRAMUSCULAR | Status: AC
  Administered 2014-05-01: 4 mg via INTRAVENOUS

## 2014-05-01 MED ORDER — POTASSIUM CHLORIDE CRYS ER 20 MEQ PO TBCR
40.0000 meq | EXTENDED_RELEASE_TABLET | Freq: Once | ORAL | Status: AC
  Administered 2014-05-01: 40 meq via ORAL
  Filled 2014-05-01: qty 2

## 2014-05-01 MED ORDER — HYDROXYZINE HCL 25 MG PO TABS: 25.0000 mg | ORAL_TABLET | Freq: Every evening | ORAL | Status: DC | PRN

## 2014-05-01 NOTE — [facility] Group Notes (Signed)
[facility] LCSW Group Therapy  Emotional Regulation 1:15 - 2:30 PM  05/01/2014 3:34 PM  Type of Therapy:  Group Therapy  Participation Level:  Minimal  Participation Quality:  Appropriate  Affect:  Appropriate and Depressed  Cognitive:  Appropriate  Insight:  Developing/Improving  Engagement in Therapy:  Developing/Improving  Modes of Intervention:  Discussion, Education, Exploration, Problem-solving, Rapport Building and Support  Summary of Progress/Problems:  Patient new to group.  She listened attentively but did not engage in the discussion.   Wynn BankerHodnett, Natale Thoma Hairston 05/01/2014, 3:34 PM

## 2014-05-01 NOTE — ED Notes (Signed)
Pt lying on stretcher, resting quietly, cooperative, denies pain, no concerns voiced at this time. Vital signs stable. Pt alert, oriented x4, responds appropriately.

## 2014-05-01 NOTE — Tx Team (Signed)
Initial Interdisciplinary Treatment Plan   PATIENT STRESSORS: Marital or family conflict   PATIENT STRENGTHS: Average or above average intelligence General fund of knowledge Physical Health Supportive family/friends   PROBLEM LIST: Problem List/Patient Goals Date to be addressed Date deferred Reason deferred Estimated date of resolution  depression 05/01/2014     "nothing"                                                 DISCHARGE CRITERIA:  Improved stabilization in mood, thinking, and/or behavior Need for constant or close observation no longer present  PRELIMINARY DISCHARGE PLAN: Return to previous living arrangement Return to previous work or school arrangements  PATIENT/FAMIILY INVOLVEMENT: This treatment plan has been presented to and reviewed with the patient, Dana Myers.  The patient and family have been given the opportunity to ask questions and make suggestions.  Leighton Parodyyson, Lorrinda Ramstad M 05/01/2014, 1:38 PM

## 2014-05-01 NOTE — BH Assessment (Addendum)
Tele Assessment Note   Dana Myers is a 29 y.o., employed, married female originally from [place] who presents to [facility] emergency dept following ingestion of approximately 15 Aleve tablets "because I got angry with my husband". Pt reported that she "didn't know what else to do" but that she was not trying to kill herself. Pt denies any hx of prior psychiatric hospitalizations or MH tx. She also denies any A/VH, SA, or SI/HI. She reports no prior suicide attempts and no hx of self-harm. No family hx of MI or SA. Pt reports no access to weapons and no hx of abuse of any kind. She has two jobs and attends [employer]. Pt has a 11 year old child and lives with her husband and several of his family members in a small home, which leads to arguments within the family and adds to the pt's stress level. The pt reports that she does not know why she took the Aleve and feels embarrassed for doing so. She says that she has never done anything like this before. Despite her lack of mental health hx, Patriciaann Clan, [place], felt that the pt met criteria for inpt treatment due to her overdose and impaired judgment. Pt presents with euthymic mood, fair eye-contact, and repeatedly states how embarrassed she is over her decision to overdose. Appearance is unremarkable, thought process is linear, and speech is relevant with no indication of delusional content. UDS and BAL were all negative.  TTS will seek inpt placement for pt. She is appropriate for [facility], pending an open bed.   Axis I: 296.20 Major depressive disorder, Single episode, Unspecified Axis II: No diagnosis Axis III: History reviewed. No pertinent past medical history. Axis IV: other psychosocial or environmental problems and problems with primary support group Axis V: 31-40 impairment in reality testing  Past Medical History: History reviewed. No pertinent past medical history.  History reviewed. No pertinent past surgical history.  Family History:  Family History   Problem Relation Age of Onset  . Heart disease Father   . Throat cancer Maternal Grandfather   . Diabetes Maternal Uncle     Uncle's on mom's side  . Hypertension Father     Social History:  reports that she has never smoked. She has never used smokeless tobacco. She reports that she does not drink alcohol or use illicit drugs.  Additional Social History:  Alcohol / Drug Use Pain Medications: None Prescriptions: None Over the Counter: None History of alcohol / drug use?: No history of alcohol / drug abuse  CIWA: CIWA-Ar BP: 92/57 mmHg Pulse Rate: 108 COWS:    PATIENT STRENGTHS: (choose at least two) Ability for insight Average or above average intelligence Capable of independent living Communication skills General fund of knowledge Physical Health Supportive family/friends  Allergies: No Known Allergies  Home Medications:  (Not in a hospital admission)  OB/GYN Status:  No LMP recorded.  General Assessment Data Location of Assessment: [facility] Services Is this a Tele or Face-to-Face Assessment?: Tele Assessment Is this an Initial Assessment or a Re-assessment for this encounter?: Initial Assessment Living Arrangements: Spouse/significant other Can pt return to current living arrangement?: Yes Admission Status: Voluntary Is patient capable of signing voluntary admission?: Yes Transfer from: Home Referral Source: Self/Family/Friend     [place] Living Arrangements: Spouse/significant other Name of Psychiatrist: None Name of Therapist: None  Education Status Is patient currently in school?: Yes Current Grade: Junior Highest grade of school patient has completed: 2nd yr college Name of  school: [employer], [employer] person: na  Risk to self with the past 6 months Suicidal Ideation: No-Not Currently/Within Last 6 Months Suicidal Intent: No-Not Currently/Within Last 6 Months Is patient at risk for suicide?: Yes Suicidal Plan?: No-Not  Currently/Within Last 6 Months Access to Means: Yes Specify Access to Suicidal Means: Pills What has been your use of drugs/alcohol within the last 12 months?: None Previous Attempts/Gestures: No How many times?: 1 Other Self Harm Risks: Lack of insight Triggers for Past Attempts: Spouse contact Intentional Self Injurious Behavior: None Family Suicide History: Unknown Recent stressful life event(s): Conflict (Comment) Persecutory voices/beliefs?: No Depression: Yes Depression Symptoms: Despondent, Tearfulness Substance abuse history and/or treatment for substance abuse?: No Suicide prevention information given to non-admitted patients: Not applicable  Risk to Others within the past 6 months Homicidal Ideation: No Thoughts of Harm to Others: No Current Homicidal Intent: No Current Homicidal Plan: No Access to Homicidal Means: No History of harm to others?: No Assessment of Violence: None Noted Does patient have access to weapons?: No Criminal Charges Pending?: No Does patient have a court date: No  Psychosis Hallucinations: None noted Delusions: None noted  Mental Status Report Appear/Hygiene: Unremarkable Eye Contact: Fair Motor Activity: Restlessness Speech: Logical/coherent Level of Consciousness: Quiet/awake Mood: Anhedonia Affect: Blunted Anxiety Level: Minimal Thought Processes: Coherent, Relevant Judgement: Partial Orientation: Person, Place, Time, Situation Obsessive Compulsive Thoughts/Behaviors: None     ADLScreening ([facility]) Patient's cognitive ability adequate to safely complete daily activities?: Yes Patient able to express need for assistance with ADLs?: Yes Independently performs ADLs?: Yes (appropriate for developmental age)  Prior Inpatient Therapy Prior Inpatient Therapy: No  Prior Outpatient Therapy Prior Outpatient Therapy: No  ADL Screening (condition at time of admission) Patient's cognitive ability adequate to safely  complete daily activities?: Yes Is the patient deaf or have difficulty hearing?: No Does the patient have difficulty seeing, even when wearing glasses/contacts?: No Does the patient have difficulty concentrating, remembering, or making decisions?: No Patient able to express need for assistance with ADLs?: Yes Does the patient have difficulty dressing or bathing?: No Independently performs ADLs?: Yes (appropriate for developmental age) Does the patient have difficulty walking or climbing stairs?: No Weakness of Legs: None Weakness of Arms/Hands: None  Home Assistive Devices/Equipment Home Assistive Devices/Equipment: None    Abuse/Neglect Assessment (Assessment to be complete while patient is alone) Physical Abuse: Denies Verbal Abuse: Denies Sexual Abuse: Denies Self-Neglect: Denies Values / Beliefs Cultural Requests During Hospitalization: None Spiritual Requests During Hospitalization: None   Advance Directives (For Healthcare) Does patient have an advance directive?: No Would patient like information on creating an advanced directive?: No - patient declined information    Additional Information 1:1 In Past 12 Months?: No CIRT Risk: No Elopement Risk: No Does patient have medical clearance?: Yes     Disposition: Per Patriciaann Clan, [place], pt meets inpt criteria. TTS to seek placement. Almyra Free (RN) from [facility] spoke directly with Frederico Hamman to address his concerns with pt's bloodwork/WBC. Pt is appropriate for [facility], pending bed.  Disposition Initial Assessment Completed for this Encounter: Yes Disposition of Patient: Inpatient treatment program Type of inpatient treatment program: Adult  Ramond Dial, [facility] Triage Specialist  05/01/2014 6:43 AM

## 2014-05-01 NOTE — ED Notes (Signed)
Pt able to ambulate to bathroom with steady gait noted - alert and oriented x4, calm and cooperative. Pt reports she did urinate a small amount.

## 2014-05-01 NOTE — [facility] Suicide Risk Assessment (Signed)
[facility] Admission Suicide Risk Assessment   Nursing information obtained from:    Demographic factors:   29 year old married female, lives with husband and his family Current Mental Status:   See below Loss Factors:   tense relationship with members of extended family Historical Factors:   denies prior history of severe depression or  Of suicide attempts Risk Reduction Factors:    resilience, responsible for family, child Total Time spent with patient: 45 minutes Principal Problem: Adjustment disorder with mixed anxiety and depressed mood Diagnosis:   Patient Active Problem List   Diagnosis Date Noted  . Adjustment disorder with mixed anxiety and depressed mood [F43.23] 05/01/2014     Continued Clinical Symptoms:  Alcohol Use Disorder Identification Test Final Score (AUDIT): 0 The "Alcohol Use Disorders Identification Test", Guidelines for Use in Primary Care, Second Edition.  World Science writer [facility]). Score between 0-7:  no or low risk or alcohol related problems. Score between 8-15:  moderate risk of alcohol related problems. Score between 16-19:  high risk of alcohol related problems. Score 20 or above:  warrants further diagnostic evaluation for alcohol dependence and treatment.   CLINICAL FACTORS:  29 year old married female, who impulsively overdosed on NSAID during an argument with husband. Reports family tension, particularly as they live with husband's brother and his wife . At this time feeling better and currently minimizing any severe depression   Musculoskeletal: Strength & Muscle Tone: within normal limits Gait & Station: normal Patient leans: N/A  Psychiatric Specialty Exam: Physical Exam  Review of Systems  Constitutional: Negative for fever, chills, weight loss, malaise/fatigue and diaphoresis.  Respiratory: Negative for cough and shortness of breath.   Cardiovascular: Negative for chest pain.  Gastrointestinal: Negative for vomiting, abdominal pain and  blood in stool.  Genitourinary: Negative for dysuria, urgency and frequency.  Musculoskeletal: Negative for myalgias.  Skin: Negative for rash.  Neurological: Negative for seizures, loss of consciousness, weakness and headaches.  Endo/Heme/Allergies: Does not bruise/bleed easily.  Psychiatric/Behavioral: Positive for depression.    Blood pressure 107/69, pulse 115, temperature 98.6 F (37 C), temperature source Oral, resp. rate 16, height 4' 10.25" (1.48 m), weight 111 lb (50.349 kg).Body mass index is 22.99 kg/(m^2).  General Appearance: Well Groomed  Patent attorney::  Good  Speech:  Normal Rate  Volume:  Normal  Mood:  at this time denies feeling particularly depressed   Affect:  Appropriate and reactive   Thought Process:  Goal Directed and Linear  Orientation:  Full (Time, Place, and Person)  Thought Content:  denies hallucinations, no delusions, ruminative about family related stressors  Suicidal Thoughts:  No at this time denies any thoughts of hurting self and  contracts for safety on unit   Homicidal Thoughts:  No  Memory:Recent and Remote grossly intact  Judgement:  Fair  Insight:  Fair  Psychomotor Activity:  Normal  Concentration:  Good  Recall:  Good  Fund of Knowledge:Good  Language: Good  Akathisia:  Negative  Handed:  Right  AIMS (if indicated):     Assets:  Communication Skills Desire for Improvement Resilience Social Support Talents/Skills  Sleep:     Cognition: WNL  ADL's:  Intact     COGNITIVE FEATURES THAT CONTRIBUTE TO RISK:  Closed-mindedness    SUICIDE RISK:   Moderate:  Frequent suicidal ideation with limited intensity, and duration, some specificity in terms of plans, no associated intent, good self-control, limited dysphoria/symptomatology, some risk factors present, and identifiable protective factors, including available and  accessible social support.  PLAN OF CARE: Patient will be admitted to inpatient psychiatric unit for stabilization and  safety. Will provide and encourage milieu participation. Provide medication management and maked adjustments as needed.  Will follow daily.    Medical Decision Making:  Review of Psycho-Social Stressors (1), Review or order clinical lab tests (1), Established Problem, Worsening (2) and Review of Medication Regimen & Side Effects (2)  I certify that inpatient services furnished can reasonably be expected to improve the patient's condition.   COBOS, Madaline GuthrieFERNANDO 05/01/2014, 5:44 PM

## 2014-05-01 NOTE — ED Notes (Signed)
Attempted to call and get update of poc from [facility], tx to assesment coordinator anna, no answer, no vm, will attempt to call again

## 2014-05-01 NOTE — ED Notes (Signed)
Notified EDP that BP continues to maintain 90s/50s.

## 2014-05-01 NOTE — [facility] Counselor (Signed)
Upon speaking with [facility]'s RN Raynelle Fanning(Julie), Donell SievertSpencer Simon PA determined that pt does meet inpt criteria. TTS to seek placement. Raynelle FanningJulie (RN) [facility] spoke directly with Karleen HampshireSpencer to address his concerns with pt's bloodwork / WBC.   [facility]   Cyndie MullAnna Koichi Platte, [facility] Triage Specialist

## 2014-05-01 NOTE — ED Notes (Signed)
Pt awake and reports that her indwelling foley cath is irritating - foley removed without difficulty.

## 2014-05-01 NOTE — BH Assessment (Signed)
[facility] Heinrichina [id number]. Writer called and notified pt's RN Aurther Lofterry. Writer then faxed support paperwork to [employer] for pt to sign. Aurther Lofterry says she will fax completed forms back to Clinical research associatewriter. Writer informed Aurther Lofterry that pt can be transferred as soon as pt is ready.   Evette Cristalaroline Paige Aimee Timmons, [place] Assessment Counselor

## 2014-05-01 NOTE — [facility] Group Notes (Signed)
Adult Psychoeducational Group Note  Date:  05/01/2014 Time:  9:18 PM  Group Topic/Focus:  Wrap-Up Group:   The focus of this group is to help patients review their daily goal of treatment and discuss progress on daily workbooks.  Participation Level:  Minimal  Participation Quality:  Attentive  Affect:  Appropriate  Cognitive:  Appropriate  Insight: Good  Engagement in Group:  Limited  Modes of Intervention:  Discussion  Additional Comments:  Renee HarderRosina is a new admit.  She said her day was good.  She expressed she is getting accustomed to the unit.  She wants to discharge tomorrow or Friday.  Dana RancherLindsay, [name] A 05/01/2014, 9:18 PM

## 2014-05-01 NOTE — Progress Notes (Signed)
Patient can be allowed to get phone to face time 29 year old [place]. Dr. Dub MikesLugo approved this. Patient has a cell phone in her locker.

## 2014-05-01 NOTE — ED Provider Notes (Signed)
0700 - Patient here s/p overdose. Awaiting Psych placement. Has elevated WBCs, chronic, had elevated WBCs dating back to 2011. Hypokalemia on labs, replaced orally. Behavioral health has a bed for the patient, anticipate transfer later today.  1. Suicide attempt by drug ingestion, initial encounter   2. Medical clearance for psychiatric admission      Dana MochaBlair Emmogene Simson, MD 05/01/14 [phone]

## 2014-05-01 NOTE — H&P (Signed)
Psychiatric Admission Assessment Adult  Patient Identification: Dana Myers MRN:  [id number] Date of Evaluation:  05/01/2014 Chief Complaint:  MAJOR DEPRESSIVE DISORDER,SINGLE EPISODE Principal Diagnosis: Adjustment disorder with mixed anxiety and depressed mood Diagnosis:   Patient Active Problem List   Diagnosis Date Noted  . Adjustment disorder with mixed anxiety and depressed mood [F43.23] 05/01/2014   History of Present Illness: Dana Myers is a 29 yo female who cam ein after overdosing on 10-15 tablets of Advil.  She is from [place] and married to a [place] husband.  She states that her brother in law is living with her and husband, Dana Myers has caused a great deal of marital stress and it was after a fight with her brother in law that she impulsively took the overdose.  She denies having been depressed.  She has been happily married for 10 years, attributing this  event as brought on by unusual tension at home.  Per nursing, husband appears very supportive.  She has a daughter who is on vacation overseas with her parents.  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  She denies ever having psychiatric help or being formally diagnosed with depression.  Per patient, shortly after the birth of her daughter who is 29 yrs old now, she  Told her OB/GYN that she was feeling "low.  She was prescribed with Paxil 10 mg but she never filled the Rx.  She defers on taking any anti depressants now.  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   Elements:  Location:  overdose. Quality:  felt hopeless, anxiety . Severity:  severe. Timing:  yesterday. Duration:  acute, described feeling "low" but this was shortly after giving birth to a child. Context:  "I had a fight with my brother in law and ended up fighting with  husband". Associated Signs/Symptoms: Depression Symptoms:  depressed mood, suicidal attempt, anxiety, (Hypo) Manic Symptoms:  Labiality of Mood, Anxiety Symptoms:  Excessive Worry, Psychotic Symptoms:  NA PTSD Symptoms: NA Total Time spent with patient: 30 min  Past Medical History: History reviewed. No pertinent past medical history. History reviewed. No pertinent past surgical history. Family History:  Family History  Problem Relation Age of Onset  . Heart disease Father   . Throat cancer Maternal Grandfather   . Diabetes Maternal Uncle     Uncle's on mom's side  . Hypertension Father    Social History:  History  Alcohol Use No     History  Drug Use No    History   Social History  . Marital Status: Married    Spouse Name: N/A  . Number of Children: N/A  . Years of Education: N/A   Social History Main Topics  . Smoking status: Never Smoker   . Smokeless tobacco: Never Used  . Alcohol Use: No  . Drug Use: No  . Sexual Activity:    Partners: Male    Birth Control/ Protection: Condom   Other Topics Concern  . None   Social History Narrative   Additional Social History: See above HPI  Musculoskeletal: Strength & Muscle Tone: within normal limits Gait & Station: normal Patient leans: N/A  Psychiatric Specialty Exam: Physical Exam  Vitals reviewed. Psychiatric: Thought content normal. Her mood appears anxious.    Review of Systems  Constitutional: Negative.  HENT: Negative.   Eyes: Negative.   Respiratory: Negative.   Cardiovascular: Negative.   Gastrointestinal: Negative.   Genitourinary: Negative.   Musculoskeletal: Negative.   Skin: Negative.   Neurological: Negative.   Endo/Heme/Allergies: Negative.   Psychiatric/Behavioral: The patient is nervous/anxious.     Blood pressure 107/69, pulse 115, temperature 98.6 F (37 C), temperature source Oral, resp. rate 16, height 4' 10.25" (1.48 m), weight 50.349 kg (111 lb).Body mass index is 22.99  kg/(m^2).  General Appearance: Fairly Groomed  Engineer, water::  Good  Speech:  Normal Rate  Volume:  Normal  Mood:  Euthymic  Affect:  Appropriate  Thought Process:  Coherent  Orientation:  Full (Time, Place, and Person)  Thought Content:  Rumination  Suicidal Thoughts:  No  Homicidal Thoughts:  No  Memory:  Immediate;   Good Recent;   Good Remote;   Good  Judgement:  Good  Insight:  Good  Psychomotor Activity:  Normal  Concentration:  Good  Recall:  Good  Fund of Knowledge:Good  Language: Good  Akathisia:  Negative  Handed:  Right  AIMS (if indicated):     Assets:  Communication Skills Desire for Improvement Physical Health Resilience Social Support Transportation  ADL's:  Intact  Cognition: WNL  Sleep:      Risk to Self: Is patient at risk for suicide?: No Risk to Others:   Prior Inpatient Therapy:   Prior Outpatient Therapy:    Alcohol Screening: 1. How often do you have a drink containing alcohol?: Never 9. Have you or someone else been injured as a result of your drinking?: No 10. Has a relative or friend or a doctor or another health worker been concerned about your drinking or suggested you cut down?: No Alcohol Use Disorder Identification Test Final Score (AUDIT): 0 Brief Intervention: Patient declined brief intervention  Allergies:  No Known Allergies Lab Results:  Results for orders placed or performed during the hospital encounter of 04/30/14 (from the past 48 hour(s))  Comprehensive metabolic panel     Status: Abnormal   Collection Time: 04/30/14  9:00 PM  Result Value Ref Range   Sodium 136 135 - 145 mmol/L   Potassium 3.2 (L) 3.5 - 5.1 mmol/L   Chloride 106 96 - 112 mmol/L   CO2 19 19 - 32 mmol/L   Glucose, Bld 199 (H) 70 - 99 mg/dL   BUN 17 6 - 23 mg/dL   Creatinine, Ser 0.57 0.50 - 1.10 mg/dL   Calcium 8.4 8.4 - 10.5 mg/dL   Total Protein 7.8 6.0 - 8.3 g/dL   Albumin 4.2 3.5 - 5.2 g/dL   AST 26 0 - 37 U/L   ALT 12 0 - 35 U/L   Alkaline  Phosphatase 44 39 - 117 U/L   Total Bilirubin 3.2 (H) 0.3 - 1.2 mg/dL   GFR calc non Af Amer >90 >90 mL/min   GFR calc Af Amer >90 >90 mL/min    Comment: (NOTE) The eGFR has been calculated using the CKD EPI equation. This calculation has not been validated in all clinical situations. eGFR's persistently <90 mL/min signify possible Chronic Kidney Disease.    Anion gap 11 5 - 15  Acetaminophen level     Status: Abnormal   Collection Time: 04/30/14  9:00 PM  Result Value Ref Range   Acetaminophen (Tylenol), Serum <10.0 (L) 10 - 30 ug/mL    Comment:        THERAPEUTIC CONCENTRATIONS VARY SIGNIFICANTLY. A RANGE OF 10-30 ug/mL  MAY BE AN EFFECTIVE CONCENTRATION FOR MANY PATIENTS. HOWEVER, SOME ARE BEST TREATED AT CONCENTRATIONS OUTSIDE THIS RANGE. ACETAMINOPHEN CONCENTRATIONS >150 ug/mL AT 4 HOURS AFTER INGESTION AND >50 ug/mL AT 12 HOURS AFTER INGESTION ARE OFTEN ASSOCIATED WITH TOXIC REACTIONS.   Ethanol     Status: None   Collection Time: 04/30/14  9:00 PM  Result Value Ref Range   Alcohol, Ethyl (B) <5 0 - 9 mg/dL    Comment:        LOWEST DETECTABLE LIMIT FOR SERUM ALCOHOL IS 11 mg/dL FOR MEDICAL PURPOSES ONLY   Lipase, blood     Status: None   Collection Time: 04/30/14  9:00 PM  Result Value Ref Range   Lipase 48 11 - 59 U/L  Salicylate level     Status: None   Collection Time: 04/30/14  9:00 PM  Result Value Ref Range   Salicylate Lvl <0.9 2.8 - 20.0 mg/dL  Troponin I     Status: None   Collection Time: 04/30/14  9:00 PM  Result Value Ref Range   Troponin I <0.03 <0.031 ng/mL    Comment:        NO INDICATION OF MYOCARDIAL INJURY.   CBC with Differential     Status: Abnormal   Collection Time: 04/30/14  9:00 PM  Result Value Ref Range   WBC 21.3 (H) 4.0 - 10.5 K/uL   RBC 4.30 3.87 - 5.11 MIL/uL   Hemoglobin 12.6 12.0 - 15.0 g/dL   HCT 36.2 36.0 - 46.0 %   MCV 84.2 78.0 - 100.0 fL   MCH 29.3 26.0 - 34.0 pg   MCHC 34.8 30.0 - 36.0 g/dL   RDW 13.3 11.5 -  15.5 %   Platelets 213 150 - 400 K/uL   Neutrophils Relative % 73 43 - 77 %   Neutro Abs 15.6 (H) 1.7 - 7.7 K/uL   Lymphocytes Relative 20 12 - 46 %   Lymphs Abs 4.2 (H) 0.7 - 4.0 K/uL   Monocytes Relative 6 3 - 12 %   Monocytes Absolute 1.3 (H) 0.1 - 1.0 K/uL   Eosinophils Relative 1 0 - 5 %   Eosinophils Absolute 0.3 0.0 - 0.7 K/uL   Basophils Relative 0 0 - 1 %   Basophils Absolute 0.0 0.0 - 0.1 K/uL  Urinalysis, Routine w reflex microscopic     Status: Abnormal   Collection Time: 04/30/14  9:00 PM  Result Value Ref Range   Color, Urine YELLOW YELLOW   APPearance HAZY (A) CLEAR   Specific Gravity, Urine 1.037 (H) 1.005 - 1.030   pH 5.0 5.0 - 8.0   Glucose, UA NEGATIVE NEGATIVE mg/dL   Hgb urine dipstick NEGATIVE NEGATIVE   Bilirubin Urine NEGATIVE NEGATIVE   Ketones, ur >80 (A) NEGATIVE mg/dL   Protein, ur NEGATIVE NEGATIVE mg/dL   Urobilinogen, UA 1.0 0.0 - 1.0 mg/dL   Nitrite NEGATIVE NEGATIVE   Leukocytes, UA NEGATIVE NEGATIVE    Comment: MICROSCOPIC NOT DONE ON URINES WITH NEGATIVE PROTEIN, BLOOD, LEUKOCYTES, NITRITE, OR GLUCOSE <1000 mg/dL.  Pregnancy, urine     Status: None   Collection Time: 04/30/14  9:00 PM  Result Value Ref Range   Preg Test, Ur NEGATIVE NEGATIVE    Comment:        THE SENSITIVITY OF THIS METHODOLOGY IS >20 mIU/mL.   Urine rapid drug screen (hosp performed)     Status: None   Collection Time: 04/30/14  9:00 PM  Result Value Ref Range  Opiates NONE DETECTED NONE DETECTED   Cocaine NONE DETECTED NONE DETECTED   Benzodiazepines NONE DETECTED NONE DETECTED   Amphetamines NONE DETECTED NONE DETECTED   Tetrahydrocannabinol NONE DETECTED NONE DETECTED   Barbiturates NONE DETECTED NONE DETECTED    Comment:        DRUG SCREEN FOR MEDICAL PURPOSES ONLY.  IF CONFIRMATION IS NEEDED FOR ANY PURPOSE, NOTIFY LAB WITHIN 5 DAYS.        LOWEST DETECTABLE LIMITS FOR URINE DRUG SCREEN Drug Class       Cutoff (ng/mL) Amphetamine      1000 Barbiturate       200 Benzodiazepine   751 Tricyclics       700 Opiates          300 Cocaine          300 THC              50   CBG monitoring, ED     Status: Abnormal   Collection Time: 04/30/14  9:18 PM  Result Value Ref Range   Glucose-Capillary 165 (H) 70 - 99 mg/dL  CBC with Differential/Platelet     Status: Abnormal   Collection Time: 05/01/14  2:25 AM  Result Value Ref Range   WBC 22.0 (H) 4.0 - 10.5 K/uL   RBC 3.86 (L) 3.87 - 5.11 MIL/uL   Hemoglobin 11.2 (L) 12.0 - 15.0 g/dL   HCT 32.4 (L) 36.0 - 46.0 %   MCV 83.9 78.0 - 100.0 fL   MCH 29.0 26.0 - 34.0 pg   MCHC 34.6 30.0 - 36.0 g/dL   RDW 13.2 11.5 - 15.5 %   Platelets 192 150 - 400 K/uL   Neutrophils Relative % 94 (H) 43 - 77 %   Neutro Abs 20.6 (H) 1.7 - 7.7 K/uL   Lymphocytes Relative 4 (L) 12 - 46 %   Lymphs Abs 0.9 0.7 - 4.0 K/uL   Monocytes Relative 2 (L) 3 - 12 %   Monocytes Absolute 0.5 0.1 - 1.0 K/uL   Eosinophils Relative 0 0 - 5 %   Eosinophils Absolute 0.0 0.0 - 0.7 K/uL   Basophils Relative 0 0 - 1 %   Basophils Absolute 0.0 0.0 - 0.1 K/uL  Basic metabolic panel     Status: Abnormal   Collection Time: 05/01/14  2:25 AM  Result Value Ref Range   Sodium 136 135 - 145 mmol/L   Potassium 3.0 (L) 3.5 - 5.1 mmol/L   Chloride 104 96 - 112 mmol/L   CO2 21 19 - 32 mmol/L   Glucose, Bld 94 70 - 99 mg/dL   BUN 10 6 - 23 mg/dL   Creatinine, Ser 0.43 (L) 0.50 - 1.10 mg/dL   Calcium 8.2 (L) 8.4 - 10.5 mg/dL   GFR calc non Af Amer >90 >90 mL/min   GFR calc Af Amer >90 >90 mL/min    Comment: (NOTE) The eGFR has been calculated using the CKD EPI equation. This calculation has not been validated in all clinical situations. eGFR's persistently <90 mL/min signify possible Chronic Kidney Disease.    Anion gap 11 5 - 15   Current Medications: No current facility-administered medications for this encounter.   PTA Medications: Prescriptions prior to admission  Medication Sig Dispense Refill Last Dose  . PARoxetine (PAXIL)  10 MG tablet Take 1 tablet (10 mg total) by mouth every morning. 30 tablet 12   . tinidazole (TINDAMAX) 500 MG tablet 4 tab at once repeat next  day 8 tablet 0     Previous Psychotropic Medications: Yes   Substance Abuse History in the last 12 months:  No.    Consequences of Substance Abuse: NA  Results for orders placed or performed during the hospital encounter of 04/30/14 (from the past 72 hour(s))  Comprehensive metabolic panel     Status: Abnormal   Collection Time: 04/30/14  9:00 PM  Result Value Ref Range   Sodium 136 135 - 145 mmol/L   Potassium 3.2 (L) 3.5 - 5.1 mmol/L   Chloride 106 96 - 112 mmol/L   CO2 19 19 - 32 mmol/L   Glucose, Bld 199 (H) 70 - 99 mg/dL   BUN 17 6 - 23 mg/dL   Creatinine, Ser 0.57 0.50 - 1.10 mg/dL   Calcium 8.4 8.4 - 10.5 mg/dL   Total Protein 7.8 6.0 - 8.3 g/dL   Albumin 4.2 3.5 - 5.2 g/dL   AST 26 0 - 37 U/L   ALT 12 0 - 35 U/L   Alkaline Phosphatase 44 39 - 117 U/L   Total Bilirubin 3.2 (H) 0.3 - 1.2 mg/dL   GFR calc non Af Amer >90 >90 mL/min   GFR calc Af Amer >90 >90 mL/min    Comment: (NOTE) The eGFR has been calculated using the CKD EPI equation. This calculation has not been validated in all clinical situations. eGFR's persistently <90 mL/min signify possible Chronic Kidney Disease.    Anion gap 11 5 - 15  Acetaminophen level     Status: Abnormal   Collection Time: 04/30/14  9:00 PM  Result Value Ref Range   Acetaminophen (Tylenol), Serum <10.0 (L) 10 - 30 ug/mL    Comment:        THERAPEUTIC CONCENTRATIONS VARY SIGNIFICANTLY. A RANGE OF 10-30 ug/mL MAY BE AN EFFECTIVE CONCENTRATION FOR MANY PATIENTS. HOWEVER, SOME ARE BEST TREATED AT CONCENTRATIONS OUTSIDE THIS RANGE. ACETAMINOPHEN CONCENTRATIONS >150 ug/mL AT 4 HOURS AFTER INGESTION AND >50 ug/mL AT 12 HOURS AFTER INGESTION ARE OFTEN ASSOCIATED WITH TOXIC REACTIONS.   Ethanol     Status: None   Collection Time: 04/30/14  9:00 PM  Result Value Ref Range   Alcohol,  Ethyl (B) <5 0 - 9 mg/dL    Comment:        LOWEST DETECTABLE LIMIT FOR SERUM ALCOHOL IS 11 mg/dL FOR MEDICAL PURPOSES ONLY   Lipase, blood     Status: None   Collection Time: 04/30/14  9:00 PM  Result Value Ref Range   Lipase 48 11 - 59 U/L  Salicylate level     Status: None   Collection Time: 04/30/14  9:00 PM  Result Value Ref Range   Salicylate Lvl <2.3 2.8 - 20.0 mg/dL  Troponin I     Status: None   Collection Time: 04/30/14  9:00 PM  Result Value Ref Range   Troponin I <0.03 <0.031 ng/mL    Comment:        NO INDICATION OF MYOCARDIAL INJURY.   CBC with Differential     Status: Abnormal   Collection Time: 04/30/14  9:00 PM  Result Value Ref Range   WBC 21.3 (H) 4.0 - 10.5 K/uL   RBC 4.30 3.87 - 5.11 MIL/uL   Hemoglobin 12.6 12.0 - 15.0 g/dL   HCT 36.2 36.0 - 46.0 %   MCV 84.2 78.0 - 100.0 fL   MCH 29.3 26.0 - 34.0 pg   MCHC 34.8 30.0 - 36.0 g/dL   RDW 13.3 11.5 -  15.5 %   Platelets 213 150 - 400 K/uL   Neutrophils Relative % 73 43 - 77 %   Neutro Abs 15.6 (H) 1.7 - 7.7 K/uL   Lymphocytes Relative 20 12 - 46 %   Lymphs Abs 4.2 (H) 0.7 - 4.0 K/uL   Monocytes Relative 6 3 - 12 %   Monocytes Absolute 1.3 (H) 0.1 - 1.0 K/uL   Eosinophils Relative 1 0 - 5 %   Eosinophils Absolute 0.3 0.0 - 0.7 K/uL   Basophils Relative 0 0 - 1 %   Basophils Absolute 0.0 0.0 - 0.1 K/uL  Urinalysis, Routine w reflex microscopic     Status: Abnormal   Collection Time: 04/30/14  9:00 PM  Result Value Ref Range   Color, Urine YELLOW YELLOW   APPearance HAZY (A) CLEAR   Specific Gravity, Urine 1.037 (H) 1.005 - 1.030   pH 5.0 5.0 - 8.0   Glucose, UA NEGATIVE NEGATIVE mg/dL   Hgb urine dipstick NEGATIVE NEGATIVE   Bilirubin Urine NEGATIVE NEGATIVE   Ketones, ur >80 (A) NEGATIVE mg/dL   Protein, ur NEGATIVE NEGATIVE mg/dL   Urobilinogen, UA 1.0 0.0 - 1.0 mg/dL   Nitrite NEGATIVE NEGATIVE   Leukocytes, UA NEGATIVE NEGATIVE    Comment: MICROSCOPIC NOT DONE ON URINES WITH NEGATIVE  PROTEIN, BLOOD, LEUKOCYTES, NITRITE, OR GLUCOSE <1000 mg/dL.  Pregnancy, urine     Status: None   Collection Time: 04/30/14  9:00 PM  Result Value Ref Range   Preg Test, Ur NEGATIVE NEGATIVE    Comment:        THE SENSITIVITY OF THIS METHODOLOGY IS >20 mIU/mL.   Urine rapid drug screen (hosp performed)     Status: None   Collection Time: 04/30/14  9:00 PM  Result Value Ref Range   Opiates NONE DETECTED NONE DETECTED   Cocaine NONE DETECTED NONE DETECTED   Benzodiazepines NONE DETECTED NONE DETECTED   Amphetamines NONE DETECTED NONE DETECTED   Tetrahydrocannabinol NONE DETECTED NONE DETECTED   Barbiturates NONE DETECTED NONE DETECTED    Comment:        DRUG SCREEN FOR MEDICAL PURPOSES ONLY.  IF CONFIRMATION IS NEEDED FOR ANY PURPOSE, NOTIFY LAB WITHIN 5 DAYS.        LOWEST DETECTABLE LIMITS FOR URINE DRUG SCREEN Drug Class       Cutoff (ng/mL) Amphetamine      1000 Barbiturate      200 Benzodiazepine   016 Tricyclics       010 Opiates          300 Cocaine          300 THC              50   CBG monitoring, ED     Status: Abnormal   Collection Time: 04/30/14  9:18 PM  Result Value Ref Range   Glucose-Capillary 165 (H) 70 - 99 mg/dL  CBC with Differential/Platelet     Status: Abnormal   Collection Time: 05/01/14  2:25 AM  Result Value Ref Range   WBC 22.0 (H) 4.0 - 10.5 K/uL   RBC 3.86 (L) 3.87 - 5.11 MIL/uL   Hemoglobin 11.2 (L) 12.0 - 15.0 g/dL   HCT 32.4 (L) 36.0 - 46.0 %   MCV 83.9 78.0 - 100.0 fL   MCH 29.0 26.0 - 34.0 pg   MCHC 34.6 30.0 - 36.0 g/dL   RDW 13.2 11.5 - 15.5 %   Platelets 192 150 - 400 K/uL  Neutrophils Relative % 94 (H) 43 - 77 %   Neutro Abs 20.6 (H) 1.7 - 7.7 K/uL   Lymphocytes Relative 4 (L) 12 - 46 %   Lymphs Abs 0.9 0.7 - 4.0 K/uL   Monocytes Relative 2 (L) 3 - 12 %   Monocytes Absolute 0.5 0.1 - 1.0 K/uL   Eosinophils Relative 0 0 - 5 %   Eosinophils Absolute 0.0 0.0 - 0.7 K/uL   Basophils Relative 0 0 - 1 %   Basophils Absolute 0.0  0.0 - 0.1 K/uL  Basic metabolic panel     Status: Abnormal   Collection Time: 05/01/14  2:25 AM  Result Value Ref Range   Sodium 136 135 - 145 mmol/L   Potassium 3.0 (L) 3.5 - 5.1 mmol/L   Chloride 104 96 - 112 mmol/L   CO2 21 19 - 32 mmol/L   Glucose, Bld 94 70 - 99 mg/dL   BUN 10 6 - 23 mg/dL   Creatinine, Ser 0.43 (L) 0.50 - 1.10 mg/dL   Calcium 8.2 (L) 8.4 - 10.5 mg/dL   GFR calc non Af Amer >90 >90 mL/min   GFR calc Af Amer >90 >90 mL/min    Comment: (NOTE) The eGFR has been calculated using the CKD EPI equation. This calculation has not been validated in all clinical situations. eGFR's persistently <90 mL/min signify possible Chronic Kidney Disease.    Anion gap 11 5 - 15    Observation Level/Precautions:  15 minute checks  Laboratory:  per ED  Psychotherapy:  Group therapy  Medications:  As per medlist  Consultations:  As needed  Discharge Concerns:  Safety  Estimated LOS:  5-7 days  Other:     Psychological Evaluations: No  Treatment Plan Summary: Daily contact with patient to assess and evaluate symptoms and progress in treatment and Medication management  Medical Decision Making:  Established Problem, Stable/Improving (1), Discuss test with performing physician (1), Decision to obtain old records (1), Review or order medicine tests (1), Review of Medication Regimen & Side Effects (2) and Review of New Medication or Change in Dosage (2)  I certify that inpatient services furnished can reasonably be expected to improve the patient's condition.   Kerrie Buffalo MAY, AGNP-BC 2/17/20162:35 PM   I have discussed patient case with NP and have met with patient. I agree with NP note, assessment, plan. 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 She impulsively overdosed on  NSAID during argument with her husband. She states they live with her husband's brother and his wife and the situation is sometimes tense .  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 In reviewing prior labs, this is chronic. Patient denies any symptoms of fever, chills, or infection, or any medical illness, other than being PPD (+) but with normal chest x rays and no symptoms.

## 2014-05-01 NOTE — [facility] Counselor (Signed)
Adult Comprehensive Assessment  Patient ID: Dana Myers, female   DOB: 1985/07/30, 29 y.o.   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 Family Relationships: Disagreement with husband because of brother and his wife living in th [employer] Financial / Lack of resources (include bankruptcy): None Housing / Lack of housing: None Physical health (include injuries & life threatening diseases): None Social relationships: None Substance abuse: None Bereavement / Loss: None  Living/Environment/Situation:  Living Arrangements: Spouse/significant other, Other relatives Living conditions (as described by patient or guardian): Good How long has patient lived in current situation?: Seven years What is atmosphere in current home: Comfortable, Supportive  Family History:  Marital status: Married Number of Years Married: 1 What types of issues is patient dealing with in the relationship?: Patient and husband have been together for twenty years but have only been married for a month.  Problems due to multiple families being in the home Additional relationship information: N/A Does patient have children?: Yes How many children?: 1 How is patient's relationship with their children?: Good relationship with 77five year old daughter  Childhood History:  By whom was/is the patient raised?: Both parents Additional childhood history information: Good childhood Description of patient's relationship with caregiver when they were a child: Close relationship with parents Patient's description of current relationship with people who raised him/her: Good relationship Does patient have siblings?: Yes Number of Siblings: 1 Description of patient's current relationship with siblings: Great relationship with sibling Did patient suffer any  verbal/emotional/physical/sexual abuse as a child?: No Did patient suffer from severe childhood neglect?: No Has patient ever been sexually abused/assaulted/raped as an adolescent or adult?: No Was the patient ever a victim of a crime or a disaster?: No Witnessed domestic violence?: No Has patient been effected by domestic violence as an adult?: No  Education:  Currently a Consulting civil engineerstudent?: No Learning disability?: No  Employment/Work Situation:   Employment situation: Employed 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 How long has patient been employed?: One yar Patient's job has been impacted by current illness: No What is the longest time patient has a held a job?: Three years Where was the patient employed at that time?: [employer] Has patient ever been in the Eli Lilly and Companymilitary?: No Has patient ever served in Buyer, retailcombat?: No  Financial Resources:   Financial resources: Income from employment, Income from spouse Does patient have a representative payee or guardian?: No  Alcohol/Substance Abuse:   If attempted suicide, did drugs/alcohol play a role in this?: No Alcohol/Substance Abuse Treatment Hx: Denies past history Has alcohol/substance abuse ever caused legal problems?: No  Social Support System:   Forensic psychologistatient's Community Support System: None Describe Community Support System: N/A Type of faith/religion: Muslim How does patient's faith help to cope with current illness?: [employer] Operating [employer]errays  Leisure/Recreation:   Leisure and Hobbies: Loves to dance and pain  Strengths/Needs:   What things does the patient do well?: Hard and honest worker In what areas does patient struggle / problems for patient: Relationship with inlaws  Discharge Plan:   Does patient have access to transportation?: Yes Will patient be returning to same living situation after discharge?: Yes Currently receiving community mental health services: No If no, would patient like referral for services when discharged?: Yes (What county?)  ([place]) Does patient have financial barriers related to discharge medications?: No  Summary/Recommendations:  Dana Myers  is a 29 years old female admitted with Major Depression Disorder.  She will benefit from crisis stabilization, evaluation for medication, psycho-education groups for coping skills development, group therapy and case management for discharge planning.     Dana Myers, Joesph July. 05/01/2014

## 2014-05-01 NOTE — [facility] Counselor (Signed)
Per Donell SievertSpencer Simon, PA, pt not accepted due to medical concerns.

## 2014-05-02 ENCOUNTER — Telehealth: Payer: Self-pay | Admitting: Hematology

## 2014-05-02 DIAGNOSIS — T50902A Poisoning by unspecified drugs, medicaments and biological substances, intentional self-harm, initial encounter: Secondary | ICD-10-CM | POA: Insufficient documentation

## 2014-05-02 NOTE — Discharge Summary (Signed)
Physician Discharge Summary Note  Patient:  Dana Myers is an 30 y.o., female MRN:  [id number] DOB:  1986/03/02 Patient phone:  [phone] (home)  Patient address:   [address] [place] [address],  Total Time spent with patient: 30 minutes  Date of Admission:  05/01/2014 Date of Discharge: 05/02/2014  Reason for Admission:  Overdose on Advil  Principal Problem: Adjustment disorder with mixed anxiety and depressed mood Discharge Diagnoses: Patient Active Problem List   Diagnosis Date Noted  . Adjustment disorder with mixed anxiety and depressed mood [F43.23] 05/01/2014   Musculoskeletal: Strength & Muscle Tone: within normal limits Gait & Station: normal Patient leans: N/A  Psychiatric Specialty Exam:  SEE SRA Physical Exam  Vitals reviewed. Psychiatric: She has a normal mood and affect. Her speech is normal and behavior is normal. Judgment and thought content normal. Cognition and memory are normal.    Review of Systems  Constitutional: Negative.   HENT: Negative.   Eyes: Negative.   Respiratory: Negative.   Cardiovascular: Negative.   Gastrointestinal: Negative.   Genitourinary: Negative.   Musculoskeletal: Negative.   Skin: Negative.   Neurological: Negative.   Endo/Heme/Allergies: Negative.   Psychiatric/Behavioral: Positive for depression and hallucinations.    Blood pressure 111/71, pulse 93, temperature 99.1 F (37.3 C), temperature source Oral, resp. rate 16, height 4' 10.25" (1.48 m), weight 50.349 kg (111 lb).Body mass index is 22.99 kg/(m^2).   Past Medical History: History reviewed. No pertinent past medical history. History reviewed. No pertinent past surgical history. Family History:  Family History  Problem Relation Age of Onset  . Heart disease Father   . Throat cancer Maternal Grandfather   . Diabetes Maternal Uncle     Uncle's on mom's side  . Hypertension Father    Social History:  History  Alcohol Use No     History  Drug  Use No    History   Social History  . Marital Status: Married    Spouse Name: N/A  . Number of Children: N/A  . Years of Education: N/A   Social History Main Topics  . Smoking status: Never Smoker   . Smokeless tobacco: Never Used  . Alcohol Use: No  . Drug Use: No  . Sexual Activity:    Partners: Male    Birth Control/ Protection: Condom   Other Topics Concern  . None   Social History Narrative   Past Psychiatric History: Hospitalizations:  None  Outpatient Care:  Denies  Substance Abuse Care:  Denies  Self-Mutilation:  Denies  Suicidal Attempts:  Denies  Violent Behaviors:  Denies   Risk to Self: Is patient at risk for suicide?: No Risk to Others:   Prior Inpatient Therapy:   Prior Outpatient Therapy:    Level of Care:  OP  Hospital Course:   Per HPI on admission:  Dana Myers is a 29 yo female who came in after overdosing on 10-15 tablets of Advil. She is from [place] and married to a [place] husband. She states that her brother in law is living with her and husband, this has caused a great deal of marital stress and it was after a fight with her brother in law that she impulsively took the overdose. She denies having been depressed. She has been happily married for 10 years, attributing this event as brought on by unusual tension at home. Per nursing, husband appears very supportive. She has a daughter who is on vacation overseas with her parents. 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 She denies ever having psychiatric help or being formally diagnosed with depression. Per patient, shortly after the birth of her daughter who is 81 yrs old now, she Told her OB/GYN that she was feeling "low. She was prescribed with Paxil 10 mg but she never filled the Rx. She defers on taking any anti depressants now. 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   Dana Myers was admitted for Adjustment disorder with mixed anxiety and depressed mood and crisis management.  She was treated with group therapy however deferred medications.  Medical problems were identified and treated as needed.  Home medications were restarted as appropriate.  Improvement was monitored by observation and Dana Myers daily report of symptom reduction.  Emotional and mental status was monitored by daily self inventory reports completed by Dana Myers and clinical staff.         Dana Myers was evaluated by the treatment team for stability and plans for continued recovery upon discharge.  She was offered further treatment options upon discharge including Residential, Intensive Outpatient and Outpatient treatment.  She will follow up with [facility] for start of any medication management and counseling.     Dana Myers motivation was an integral factor for scheduling further treatment.  Employment, health status and family support were also considered during her hospital stay.  Upon completion of this admission the patient was both mentally and medically stable for discharge denying suicidal/homicidal ideation, auditory/visual/tactile hallucinations, delusional thoughts and paranoia.      To note:  She has evaluated WBC and appt set up with [facility] for further evaluation on May 21, 2014.  Patient made aware.    Consults:  hematology/oncology  Significant Diagnostic Studies:  labs: per ED, elevated WBC, abnormal results on CBC with diff  Discharge Vitals:   Blood pressure 111/71, pulse 93, temperature 99.1 F (37.3 C), temperature source Oral, resp. rate 16, height 4' 10.25" (1.48 m), weight 50.349 kg (111 lb). Body mass index is 22.99 kg/(m^2). Lab Results:   Results for orders placed or performed during the hospital encounter of 04/30/14 (from the past 72 hour(s))   Comprehensive metabolic panel     Status: Abnormal   Collection Time: 04/30/14  9:00 PM  Result Value Ref Range   Sodium 136 135 - 145 mmol/L   Potassium 3.2 (L) 3.5 - 5.1 mmol/L   Chloride 106 96 - 112 mmol/L   CO2 19 19 - 32 mmol/L   Glucose, Bld 199 (H) 70 - 99 mg/dL   BUN 17 6 - 23 mg/dL   Creatinine, Ser 0.57 0.50 - 1.10 mg/dL   Calcium 8.4 8.4 - 10.5 mg/dL   Total Protein 7.8 6.0 - 8.3 g/dL   Albumin 4.2 3.5 - 5.2 g/dL   AST 26 0 - 37 U/L   ALT 12 0 - 35 U/L   Alkaline Phosphatase 44 39 - 117 U/L   Total Bilirubin 3.2 (H) 0.3 - 1.2 mg/dL   GFR calc non Af Amer >90 >90 mL/min   GFR calc Af Amer >90 >90 mL/min    Comment: (NOTE) The eGFR has been calculated using the CKD EPI equation. This calculation has not been validated in all clinical situations. eGFR's persistently <90 mL/min signify possible Chronic  Kidney Disease.    Anion gap 11 5 - 15  Acetaminophen level     Status: Abnormal   Collection Time: 04/30/14  9:00 PM  Result Value Ref Range   Acetaminophen (Tylenol), Serum <10.0 (L) 10 - 30 ug/mL    Comment:        THERAPEUTIC CONCENTRATIONS VARY SIGNIFICANTLY. A RANGE OF 10-30 ug/mL MAY BE AN EFFECTIVE CONCENTRATION FOR MANY PATIENTS. HOWEVER, SOME ARE BEST TREATED AT CONCENTRATIONS OUTSIDE THIS RANGE. ACETAMINOPHEN CONCENTRATIONS >150 ug/mL AT 4 HOURS AFTER INGESTION AND >50 ug/mL AT 12 HOURS AFTER INGESTION ARE OFTEN ASSOCIATED WITH TOXIC REACTIONS.   Ethanol     Status: None   Collection Time: 04/30/14  9:00 PM  Result Value Ref Range   Alcohol, Ethyl (B) <5 0 - 9 mg/dL    Comment:        LOWEST DETECTABLE LIMIT FOR SERUM ALCOHOL IS 11 mg/dL FOR MEDICAL PURPOSES ONLY   Lipase, blood     Status: None   Collection Time: 04/30/14  9:00 PM  Result Value Ref Range   Lipase 48 11 - 59 U/L  Salicylate level     Status: None   Collection Time: 04/30/14  9:00 PM  Result Value Ref Range   Salicylate Lvl <5.4 2.8 - 20.0 mg/dL  Troponin I     Status:  None   Collection Time: 04/30/14  9:00 PM  Result Value Ref Range   Troponin I <0.03 <0.031 ng/mL    Comment:        NO INDICATION OF MYOCARDIAL INJURY.   CBC with Differential     Status: Abnormal   Collection Time: 04/30/14  9:00 PM  Result Value Ref Range   WBC 21.3 (H) 4.0 - 10.5 K/uL   RBC 4.30 3.87 - 5.11 MIL/uL   Hemoglobin 12.6 12.0 - 15.0 g/dL   HCT 36.2 36.0 - 46.0 %   MCV 84.2 78.0 - 100.0 fL   MCH 29.3 26.0 - 34.0 pg   MCHC 34.8 30.0 - 36.0 g/dL   RDW 13.3 11.5 - 15.5 %   Platelets 213 150 - 400 K/uL   Neutrophils Relative % 73 43 - 77 %   Neutro Abs 15.6 (H) 1.7 - 7.7 K/uL   Lymphocytes Relative 20 12 - 46 %   Lymphs Abs 4.2 (H) 0.7 - 4.0 K/uL   Monocytes Relative 6 3 - 12 %   Monocytes Absolute 1.3 (H) 0.1 - 1.0 K/uL   Eosinophils Relative 1 0 - 5 %   Eosinophils Absolute 0.3 0.0 - 0.7 K/uL   Basophils Relative 0 0 - 1 %   Basophils Absolute 0.0 0.0 - 0.1 K/uL  Urinalysis, Routine w reflex microscopic     Status: Abnormal   Collection Time: 04/30/14  9:00 PM  Result Value Ref Range   Color, Urine YELLOW YELLOW   APPearance HAZY (A) CLEAR   Specific Gravity, Urine 1.037 (H) 1.005 - 1.030   pH 5.0 5.0 - 8.0   Glucose, UA NEGATIVE NEGATIVE mg/dL   Hgb urine dipstick NEGATIVE NEGATIVE   Bilirubin Urine NEGATIVE NEGATIVE   Ketones, ur >80 (A) NEGATIVE mg/dL   Protein, ur NEGATIVE NEGATIVE mg/dL   Urobilinogen, UA 1.0 0.0 - 1.0 mg/dL   Nitrite NEGATIVE NEGATIVE   Leukocytes, UA NEGATIVE NEGATIVE    Comment: MICROSCOPIC NOT DONE ON URINES WITH NEGATIVE PROTEIN, BLOOD, LEUKOCYTES, NITRITE, OR GLUCOSE <1000 mg/dL.  Pregnancy, urine     Status: None   Collection  Time: 04/30/14  9:00 PM  Result Value Ref Range   Preg Test, Ur NEGATIVE NEGATIVE    Comment:        THE SENSITIVITY OF THIS METHODOLOGY IS >20 mIU/mL.   Urine rapid drug screen (hosp performed)     Status: None   Collection Time: 04/30/14  9:00 PM  Result Value Ref Range   Opiates NONE DETECTED  NONE DETECTED   Cocaine NONE DETECTED NONE DETECTED   Benzodiazepines NONE DETECTED NONE DETECTED   Amphetamines NONE DETECTED NONE DETECTED   Tetrahydrocannabinol NONE DETECTED NONE DETECTED   Barbiturates NONE DETECTED NONE DETECTED    Comment:        DRUG SCREEN FOR MEDICAL PURPOSES ONLY.  IF CONFIRMATION IS NEEDED FOR ANY PURPOSE, NOTIFY LAB WITHIN 5 DAYS.        LOWEST DETECTABLE LIMITS FOR URINE DRUG SCREEN Drug Class       Cutoff (ng/mL) Amphetamine      1000 Barbiturate      200 Benzodiazepine   546 Tricyclics       270 Opiates          300 Cocaine          300 THC              50   CBG monitoring, ED     Status: Abnormal   Collection Time: 04/30/14  9:18 PM  Result Value Ref Range   Glucose-Capillary 165 (H) 70 - 99 mg/dL  CBC with Differential/Platelet     Status: Abnormal   Collection Time: 05/01/14  2:25 AM  Result Value Ref Range   WBC 22.0 (H) 4.0 - 10.5 K/uL   RBC 3.86 (L) 3.87 - 5.11 MIL/uL   Hemoglobin 11.2 (L) 12.0 - 15.0 g/dL   HCT 32.4 (L) 36.0 - 46.0 %   MCV 83.9 78.0 - 100.0 fL   MCH 29.0 26.0 - 34.0 pg   MCHC 34.6 30.0 - 36.0 g/dL   RDW 13.2 11.5 - 15.5 %   Platelets 192 150 - 400 K/uL   Neutrophils Relative % 94 (H) 43 - 77 %   Neutro Abs 20.6 (H) 1.7 - 7.7 K/uL   Lymphocytes Relative 4 (L) 12 - 46 %   Lymphs Abs 0.9 0.7 - 4.0 K/uL   Monocytes Relative 2 (L) 3 - 12 %   Monocytes Absolute 0.5 0.1 - 1.0 K/uL   Eosinophils Relative 0 0 - 5 %   Eosinophils Absolute 0.0 0.0 - 0.7 K/uL   Basophils Relative 0 0 - 1 %   Basophils Absolute 0.0 0.0 - 0.1 K/uL  Basic metabolic panel     Status: Abnormal   Collection Time: 05/01/14  2:25 AM  Result Value Ref Range   Sodium 136 135 - 145 mmol/L   Potassium 3.0 (L) 3.5 - 5.1 mmol/L   Chloride 104 96 - 112 mmol/L   CO2 21 19 - 32 mmol/L   Glucose, Bld 94 70 - 99 mg/dL   BUN 10 6 - 23 mg/dL   Creatinine, Ser 0.43 (L) 0.50 - 1.10 mg/dL   Calcium 8.2 (L) 8.4 - 10.5 mg/dL   GFR calc non Af Amer >90 >90  mL/min   GFR calc Af Amer >90 >90 mL/min    Comment: (NOTE) The eGFR has been calculated using the CKD EPI equation. This calculation has not been validated in all clinical situations. eGFR's persistently <90 mL/min signify possible Chronic Kidney Disease.    Anion  gap 11 5 - 15    Physical Findings: AIMS:  , ,  ,  ,    CIWA:    COWS:      See Psychiatric Specialty Exam and Suicide Risk Assessment completed by Attending Physician prior to discharge.  Discharge destination:  Home  Is patient on multiple antipsychotic therapies at discharge:  No   Has Patient had three or more failed trials of antipsychotic monotherapy by history:  No    Recommended Plan for Multiple Antipsychotic Therapies: NA     Medication List    ASK your doctor about these medications      Indication   PARoxetine 10 MG tablet  Commonly known as:  PAXIL  Take 1 tablet (10 mg total) by mouth every morning.      tinidazole 500 MG tablet  Commonly known as:  TINDAMAX  4 tab at once repeat next day            Follow-up Information    Follow up with [facility] On 05/21/2014.   Why:  You will see Dr Burr Medico.  Please arrive by 10:30 for an 11 am appt [phone]      Follow up with Dr. Adele Schilder - [facility] On 05/15/2014.   Why:  You are scheduled with Dr. Adele Schilder on Wednesday, May 15, 2014 at [facility] information:   [address] [place], [place]   [address]  [phone]      Follow up with [facility] On 05/15/2014.   Why:  You are scheduled with Tye Maryland at 11 AM on Wednesday, May 15, 2014 following your appointment with Dr. Emeline Darling information:   [address] [place], [place]   [address]  [phone]      Follow-up recommendations:  Activity:  as tol, diet as tol  Comments:  1.  Take all your medications as prescribed.              2.  Report any adverse side effects to outpatient provider.                        3.  Patient instructed to not use alcohol or illegal drugs while on prescription medicines.            4.  In the event of worsening symptoms, instructed patient to call 911, the crisis hotline or go to nearest emergency room for evaluation of symptoms.  Total Discharge Time:  30 min  Signed: Kerrie Buffalo MAY, AGNP-BC 05/02/2014, 1:35 PM   Patient seen, Suicide Assessment Completed.  Disposition Plan Reviewed

## 2014-05-02 NOTE — Progress Notes (Signed)
  [facility] :  Will you be returning to the same living situation after discharge:  Yes,  Patient is returning home with husband. At discharge, do you have transportation home?: Yes,  Patient to arrange transportation home. Do you have the ability to pay for your medications: Yes,  Patient is able to obtain medications.  Release of information consent forms completed and in the chart;  Patient's signature needed at discharge.  Patient to Follow up at: Follow-up Information    Follow up with [facility] On 05/21/2014.   Why:  You will see Dr Mosetta PuttFeng.  Please arrive by 10:30 for an 11 am appt [phone]      [facility]en [facility] On 05/15/2014.   Why:  You are scheduled with Dr. Lolly MustacheArfeen on Wednesday, May 15, 2014 at [facility]   Contact information:   [address] [place], [place]   [id number]  [phone]      Follow up with Romona CurlsLee Anne Yates [facility] On 05/15/2014.   Why:  You are scheduled with Glena NorfolkLee Ann Yates at 11 AM on Wednesday, May 15, 2014 following your appointment with Dr. Franklyn LorArfeen   Contact information:   [address] [place], [place]   [address]  [phone]      Patient denies SI/HI:  Patient no longer endorsing SI/HI or other thoughts of self harm.     Safety Planning and Suicide Prevention discussed:  .Reviewed with all patients during discharge planning group   Have you used any form of tobacco in the last 30 days? (Cigarettes, Smokeless Tobacco, Cigars, and/or Pipes): No  Has patient been referred to the Quitline?: No referral to Quitline needed. Dana Myers 05/02/2014, 1:21 PM

## 2014-05-02 NOTE — Telephone Encounter (Signed)
Received a call from Bernarda CaffeySheila Agustin-NP at [facility] schedule a pt with [facility] Referring Dr. Nehemiah Massedobos Fernando Pt is aware

## 2014-05-02 NOTE — [facility] Suicide Risk Assessment (Signed)
[facility] INPATIENT:  Family/Significant Other Suicide Prevention Education  Suicide Prevention Education:  Education Completed; Dana Myers, Husband, [phone]; has been identified by the patient as the family member/significant other with whom the patient will be residing, and identified as the person(s) who will aid the patient in the event of a mental health crisis (suicidal ideations/suicide attempt).  With written consent from the patient, the family member/significant other has been provided the following suicide prevention education, prior to the and/or following the discharge of the patient.  The suicide prevention education provided includes the following:  Suicide risk factors  Suicide prevention and interventions  National Suicide Hotline telephone number  [facility] assessment telephone number  [facility] Emergency Assistance 911  [facility] and/or Residential Mobile Crisis Unit telephone number  Request made of family/significant other to:  Remove weapons (e.g., guns, rifles, knives), all items previously/currently identified as safety concern.   Husband advised patient does not have access to weapons.   to secure guns prior to patient discharging.    Remove drugs/medications (over-the-counter, prescriptions, illicit drugs), all items previously/currently identified as a safety concern.  The family member/significant other verbalizes understanding of the suicide prevention education information provided.  The family member/significant other agrees to remove the items of safety concern listed above.  Dana Myers, Dana Myers 05/02/2014, 8:53 AM

## 2014-05-02 NOTE — [facility] Group Notes (Signed)
The focus of this group is to educate the patient on the purpose and policies of crisis stabilization and provide a format to answer questions about their admission.  The group details unit policies and expectations of patients while admitted. Patient attended group and established a goal for the day. 

## 2014-05-02 NOTE — Plan of Care (Signed)
Problem: Diagnosis: Increased Risk For Suicide Attempt Goal: STG-Patient Will Attend All Groups On The Unit Outcome: Progressing Pt attended evening wrap up group     

## 2014-05-02 NOTE — [facility] Suicide Risk Assessment (Signed)
[facility] Discharge Suicide Risk Assessment   Demographic Factors:  29 year old female, married, employed   Total Time spent with patient: 30 minutes  Musculoskeletal: Strength & Muscle Tone: within normal limits Gait & Station: normal Patient leans: N/A  Psychiatric Specialty Exam: Physical Exam  Review of Systems  Constitutional: Negative for fever, chills and weight loss.  Respiratory: Negative for shortness of breath.   Cardiovascular: Negative for chest pain.  Gastrointestinal: Negative for vomiting and abdominal pain.  Genitourinary: Negative for dysuria, urgency and frequency.  Musculoskeletal: Negative for myalgias.  Skin: Negative for rash.  Neurological: Negative for seizures and headaches.    Blood pressure 111/71, pulse 93, temperature 99.1 F (37.3 C), temperature source Oral, resp. rate 16, height 4' 10.25" (1.48 m), weight 111 lb (50.349 kg).Body mass index is 22.99 kg/(m^2).  General Appearance: Well Groomed  Patent attorney::  Good  Speech:  Normal Rate409  Volume:  Normal  Mood:  Euthymic  Affect:  Appropriate, reactive  Thought Process:  Goal Directed and Linear  Orientation:  Full (Time, Place, and Person)  Thought Content:  no hallucinations, no delusions  Suicidal Thoughts:  No No gross cognitive deficits noted upon discharge. Is alert , attentive, and oriented x 3   Homicidal Thoughts:  No  Memory:  recent and remote grossly intact   Judgement:  Good  Insight:  Present  Psychomotor Activity:  Normal  Concentration:  Good  Recall:  Good  Fund of Knowledge:Good  Language: Good  Akathisia:  No  Handed:  Right  AIMS (if indicated):     Assets:  Communication Skills Desire for Improvement Resilience Social Support Talents/Skills  Sleep:  Number of Hours: 6.75  Cognition: WNL  ADL's:  Improved    Have you used any form of tobacco in the last 30 days? (Cigarettes, Smokeless Tobacco, Cigars, and/or Pipes): No  Has this patient used any form of tobacco  in the last 30 days? (Cigarettes, Smokeless Tobacco, Cigars, and/or Pipes) No  Mental Status Per Nursing Assessment::   On Admission:     Current Mental Status by Physician: At this time patient is much improved, presents euthymic, full range of affect, no thought disorder, no SI or HI, no psychotic symptoms, future oriented, looking forward to returning to her family and to work.   Loss Factors: strain  with extended family- brother in law and sister in law had been living with  her and her husband, which was stressful. Patient states they have left, so she expects this stressor to be solved when she returns home  Historical Factors: Does not endorse prior severe depressive episodes or  History of suicide attempts in the past   Risk Reduction Factors:   Responsible for children under 38 years of age, Sense of responsibility to family, Employed, Living with another person, especially a relative and Positive coping skills or problem solving skills  Continued Clinical Symptoms:  As noted, currently much improved, with full range of affect, euthymic mood, no SI or HI, behavior calm and in good control, future oriented   Cognitive Features That Contribute To Risk:  No gross cognitive deficits noted upon discharge. Is alert , attentive, and oriented x 3   Suicide Risk:  Mild:  Suicidal ideation of limited frequency, intensity, duration, and specificity.  There are no identifiable plans, no associated intent, mild dysphoria and related symptoms, good self-control (both objective and subjective assessment), few other risk factors, and identifiable protective factors, including available and accessible social support.  Principal Problem: Adjustment disorder with mixed anxiety and depressed mood Discharge Diagnoses:  Patient Active Problem List   Diagnosis Date Noted  . Adjustment disorder with mixed anxiety and depressed mood [F43.23] 05/01/2014    Follow-up Information    Follow up with  [facility] On 05/21/2014.   Why:  You will see Dr Mosetta PuttFeng.  Please arrive by 10:30 for an 11 am appt [phone]      Follow [facility] L[facility]Arfe[facility] On 05/15/2014.   Why:  You are scheduled with Dr. Lolly MustacheArfeen on Wednesday, May 15, 2014 at [facility]   Contact information:   [address] [place], [place]   [address]  [phone]      Follow up with Romona Cur[facility]Lee Anne Yates [facility] On 05/15/2014.   Why:  You are scheduled with Glena NorfolkLee Ann Yates at 11 AM on Wednesday, May 15, 2014 following your appointment with Dr. Franklyn LorArfeen   Contact information:   [address] [place], [place]   [address]  [phone]      Plan Of Care/Follow-up recommendations:  Activity:  As tolerated Diet:  Regular Tests:  NA Other:  see below  Is patient on multiple antipsychotic therapies at discharge:  No   Has Patient had three or more failed trials of antipsychotic monotherapy by history:  No  Recommended Plan for Multiple Antipsychotic Therapies: NA   Patient is being discharged in good spirits. Plans to return home . She plans to follow up as above. As noted, was found to have chronic leukocytosis, without any symptoms or evidence of infection. As such , she is agreeing to referral to hematologist for ongoing outpatient monitoring and treatment if indicated .    COBOS, FERNANDO 05/02/2014, 2:50 PM

## 2014-05-02 NOTE — Progress Notes (Signed)
In reviewing lab work, it was noted that patient had elevated WBC.  Discussed case with TRH Vega MD, CXR and urine negative.  Patient asymptomatic.  He determined that further consult would be better served by hematology.  Will inform Dr Jama Flavorsobos for further advise.

## 2014-05-02 NOTE — Progress Notes (Signed)
Patient ID: Dana Myers, female   DOB: 07-Dec-1985, 29 y.o.   MRN: [id number] She has been discharged  Home and her husbands picked her up. She voiced understanding of discharge teaching about follow up care  Appointments.  All her belongs were taken home with her.  She denies thoughts of SI. Self inventory this AM:  No  Depression,anxiety or hopelessness. No SI thoughts or pain. Plan is to be discharged. .Marland Kitchen

## 2014-05-02 NOTE — Progress Notes (Signed)
Patient ID: Dana Myers, female   DOB: [birth year], 29 y.o.   MRN: [id number] D: Patient alert and cooperative. Pt reports feeling regretful about overdosing. Pt reports goal is to learn coping skills so above incident does not happen again. Pt denies SI/HI, pain all feeling hopelessness. Pt denies AVH.   A:  Emotional support given and will continue to monitor pt's progress for stabilization.  R: Patient remains safe.

## 2014-05-06 NOTE — Progress Notes (Signed)
Patient Discharge Instructions:  Next Level Care Provider Has Access to the EMR, 05/06/14  Records provided to [facility] via [facility]/Epic access.  Jerelene ReddenSheena E Avera, 05/06/2014, 1:39 PM

## 2014-05-08 ENCOUNTER — Telehealth: Payer: Self-pay | Admitting: Hematology

## 2014-05-08 NOTE — Telephone Encounter (Signed)
Lt mess regarding appt on 05/21/14

## 2014-05-15 ENCOUNTER — Ambulatory Visit (HOSPITAL_COMMUNITY): Payer: Self-pay | Admitting: Psychiatry

## 2014-05-17 ENCOUNTER — Ambulatory Visit (HOSPITAL_COMMUNITY): Payer: Self-pay | Admitting: Psychology

## 2014-05-21 ENCOUNTER — Encounter: Payer: Self-pay | Admitting: Hematology

## 2014-05-21 ENCOUNTER — Ambulatory Visit (HOSPITAL_BASED_OUTPATIENT_CLINIC_OR_DEPARTMENT_OTHER): Payer: 59

## 2014-05-21 ENCOUNTER — Ambulatory Visit (HOSPITAL_BASED_OUTPATIENT_CLINIC_OR_DEPARTMENT_OTHER): Payer: 59 | Admitting: Hematology

## 2014-05-21 ENCOUNTER — Ambulatory Visit: Payer: 59

## 2014-05-21 ENCOUNTER — Telehealth: Payer: Self-pay | Admitting: Hematology

## 2014-05-21 VITALS — BP 112/62 | HR 81 | Temp 98.1°F | Resp 19 | Ht <= 58 in | Wt 108.8 lb

## 2014-05-21 DIAGNOSIS — D72829 Elevated white blood cell count, unspecified: Secondary | ICD-10-CM

## 2014-05-21 DIAGNOSIS — D649 Anemia, unspecified: Secondary | ICD-10-CM

## 2014-05-21 DIAGNOSIS — F4320 Adjustment disorder, unspecified: Secondary | ICD-10-CM

## 2014-05-21 LAB — CBC & DIFF AND RETIC
BASO%: 0.4 % (ref 0.0–2.0)
Basophils Absolute: 0 10*3/uL (ref 0.0–0.1)
EOS ABS: 0.1 10*3/uL (ref 0.0–0.5)
EOS%: 1.4 % (ref 0.0–7.0)
HCT: 38 % (ref 34.8–46.6)
HGB: 13.2 g/dL (ref 11.6–15.9)
Immature Retic Fract: 2.6 % (ref 1.60–10.00)
LYMPH%: 31.8 % (ref 14.0–49.7)
MCH: 29.6 pg (ref 25.1–34.0)
MCHC: 34.7 g/dL (ref 31.5–36.0)
MCV: 85.2 fL (ref 79.5–101.0)
MONO#: 0.7 10*3/uL (ref 0.1–0.9)
MONO%: 10.1 % (ref 0.0–14.0)
NEUT#: 3.9 10*3/uL (ref 1.5–6.5)
NEUT%: 56.3 % (ref 38.4–76.8)
Platelets: 231 10*3/uL (ref 145–400)
RBC: 4.46 10*6/uL (ref 3.70–5.45)
RDW: 13.9 % (ref 11.2–14.5)
RETIC %: 1.49 % (ref 0.70–2.10)
RETIC CT ABS: 66.45 10*3/uL (ref 33.70–90.70)
WBC: 6.9 10*3/uL (ref 3.9–10.3)
lymph#: 2.2 10*3/uL (ref 0.9–3.3)

## 2014-05-21 LAB — COMPREHENSIVE METABOLIC PANEL (CC13)
ALT: 7 U/L (ref 0–55)
AST: 14 U/L (ref 5–34)
Albumin: 4 g/dL (ref 3.5–5.0)
Alkaline Phosphatase: 45 U/L (ref 40–150)
Anion Gap: 9 mEq/L (ref 3–11)
BILIRUBIN TOTAL: 0.6 mg/dL (ref 0.20–1.20)
BUN: 7.9 mg/dL (ref 7.0–26.0)
CO2: 24 mEq/L (ref 22–29)
CREATININE: 0.7 mg/dL (ref 0.6–1.1)
Calcium: 9.2 mg/dL (ref 8.4–10.4)
Chloride: 108 mEq/L (ref 98–109)
EGFR: >90 mL/min/{1.73_m2} (ref >90)
Glucose: 105 mg/dl (ref 70–140)
Potassium: 3.7 mEq/L (ref 3.5–5.1)
SODIUM: 140 meq/L (ref 136–145)
Total Protein: 7.2 g/dL (ref 6.4–8.3)

## 2014-05-21 LAB — IRON AND TIBC CHCC
%SAT: 27 % (ref 21–57)
IRON: 94 ug/dL (ref 41–142)
TIBC: 351 ug/dL (ref 236–444)
UIBC: 257 ug/dL (ref 120–384)

## 2014-05-21 LAB — FERRITIN CHCC: Ferritin: 45 ng/ml (ref 9–269)

## 2014-05-21 LAB — LACTATE DEHYDROGENASE (CC13): LDH: 156 U/L (ref 125–245)

## 2014-05-21 LAB — MORPHOLOGY
PLT EST: ADEQUATE
RBC COMMENTS: NORMAL

## 2014-05-21 NOTE — Progress Notes (Signed)
Checked in new pt with no financial concerns at this time.  Pt is here for a hematology concern so financial assistance may not be needed but she has my card for any billing questions or concerns.  ° °

## 2014-05-21 NOTE — Progress Notes (Addendum)
[place]  [phone] [phone]  Clinic New consult Note   Patient Care Team: No Pcp Per Patient as PCP - General (General Practice) 05/21/2014  CHIEF COMPLAINTS/PURPOSE OF CONSULTATION:  Leukocytosis   Referring physician: Dr. Parke Poisson  HISTORY OF PRESENTING ILLNESS:  Dana Myers 29 y.o. female originally from [place], is here because of leukocytosis.   She was previously healthy and active without any medical problems. She delivered her daughter 5 years ago. Her white count was 12.5 before the delivery, and when to 18.4 after the delivery. She did not follow up on that. She did not have a primary  care physician in the past 5 years, but didn't go to the urgent care once last year, and was not told she had abnormal blood count on lab test.  [facility] due to the overdose of Advil. She had a lot of stress at home with her brother-in-law who lives with them. It caused a great deal of marital stress and she took 10-15 tablets of Advil after she had a fight with her brother-in-law. She denies depression or anxiety. She was not put on any psych medication. During her hospitalization, her routine CBC showed WBC 18.4, with a pursued neutrophil 15.6, absolute in 4 sites 4.2, and monocytes 1.3. No blast cells. She was subsequently discharged home. Her chest x-ray showed a granuloma, and she subsequently had a CT chest at [place] last week, which was negative, per pt.   She denies any significant episodes of infection in her life, except a few episodes of UTI, last one was approximately a year ago. 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  She feels well. She has intermittent mild low back pain, cough with clear mucus production in the morning which  has been chronic in the past several years. She otherwise denies any other symptoms. She has good energy and appetite, no fever or chills, no night sweats, her weight has been stable lately.   MEDICAL HISTORY:  Adjustment disorder  SURGICAL HISTORY: History reviewed. No pertinent past surgical history.  SOCIAL HISTORY: History   Social History  . Marital Status: Married    Spouse Name: N/A  . Number of Children: 1  . Years of Education: N/A   Occupational History  . Not on file.   Social History Main Topics  . Smoking status: Never Smoker   . Smokeless tobacco: Never Used  . Alcohol Use: No  . Drug Use: No  . Sexual Activity:    Partners: Male    Birth Control/ Protection: Condom   Other Topics Concern  . Not on file   Social History Narrative    FAMILY HISTORY: Family History  Problem Relation Age of Onset  . Heart disease Father   . Hypertension Father   . Throat cancer Maternal Grandfather   . Cancer Maternal Grandfather 33    thorat cancer   . Diabetes Maternal Uncle     Uncle's on mom's side    ALLERGIES:  is allergic to tinidazole.  MEDICATIONS:  She not on any medication  REVIEW OF SYSTEMS:   Constitutional: Denies fevers, chills or abnormal night sweats Eyes: Denies blurriness of vision, double vision or watery eyes Ears, nose, mouth, throat, and face: Denies mucositis or  sore throat Respiratory: Denies cough, dyspnea or wheezes Cardiovascular: Denies palpitation, chest discomfort or lower extremity swelling Gastrointestinal:  Denies nausea, heartburn or change in bowel habits Skin: Denies abnormal skin rashes Lymphatics: Denies new lymphadenopathy or easy bruising Neurological:Denies numbness, tingling or new weaknesses Behavioral/Psych: Mood is stable, no new changes  All other systems were reviewed with the patient and are negative.  PHYSICAL EXAMINATION: ECOG PERFORMANCE STATUS: 0 - Asymptomatic  Filed Vitals:   05/21/14 1046  BP:  112/62  Pulse: 81  Temp: 98.1 F (36.7 C)  Resp: 19   Filed Weights   05/21/14 1046  Weight: 108 lb 12.8 oz (49.351 kg)    GENERAL:alert, no distress and comfortable SKIN: skin color, texture, turgor are normal, no rashes or significant lesions EYES: normal, conjunctiva are pink and non-injected, sclera clear OROPHARYNX:no exudate, no erythema and lips, buccal mucosa, and tongue normal  NECK: supple, thyroid normal size, non-tender, without nodularity LYMPH:  no palpable lymphadenopathy in the cervical, axillary or inguinal LUNGS: clear to auscultation and percussion with normal breathing effort HEART: regular rate & rhythm and no murmurs and no lower extremity edema ABDOMEN:abdomen soft, non-tender and normal bowel sounds Musculoskeletal:no cyanosis of digits and no clubbing  PSYCH: alert & oriented x 3 with fluent speech NEURO: no focal motor/sensory deficits  LABORATORY DATA:  I have reviewed the data as listed CBC Latest Ref Rng 05/01/2014 04/30/2014 08/07/2009  WBC 4.0 - 10.5 K/uL 22.0(H) 21.3(H) 18.4(H)  Hemoglobin 12.0 - 15.0 g/dL 11.2(L) 12.6 10.2 DELTA CHECK NOTED REPEATED TO VERIFY(L)  Hematocrit 36.0 - 46.0 % 32.4(L) 36.2 29.2(L)  Platelets 150 - 400 K/uL 192 213 158 DELTA CHECK NOTED REPEATED TO VERIFY SPECIMEN CHECKED FOR CLOTS    CMP Latest Ref Rng 05/01/2014 04/30/2014  Glucose 70 - 99 mg/dL 94 199(H)  BUN 6 - 23 mg/dL 10 17  Creatinine 0.50 - 1.10 mg/dL 0.43(L) 0.57  Sodium 135 - 145 mmol/L 136 136  Potassium 3.5 - 5.1 mmol/L 3.0(L) 3.2(L)  Chloride 96 - 112 mmol/L 104 106  CO2 19 - 32 mmol/L 21 19  Calcium 8.4 - 10.5 mg/dL 8.2(L) 8.4  Total Protein 6.0 - 8.3 g/dL - 7.8  Total Bilirubin 0.3 - 1.2 mg/dL - 3.2(H)  Alkaline Phos 39 - 117 U/L - 44  AST 0 - 37 U/L - 26  ALT 0 - 35 U/L - 12     RADIOGRAPHIC STUDIES: I have personally reviewed the radiological images as listed and agreed with the findings in the report. Dg Chest 2 View  05/01/2014   CLINICAL  DATA:  Medical clearance for pre admission  EXAM: CHEST  2 VIEW  COMPARISON:  09/25/2013  FINDINGS: The heart size and mediastinal contours are within normal limits. Both lungs are clear except for an unchanged left upper lobe granuloma. The visualized skeletal structures are unremarkable.  IMPRESSION: No active cardiopulmonary disease.   Electronically Signed   By: Andreas Newport M.D.   On: 05/01/2014 03:49   CT chest with contrast on 05/14/2014 (from the [place]) -there is a 3 mm calcified granuloma in the left upper lobe. No acute intrathoracic abnormality   ASSESSMENT & PLAN:  29 year old female from [place], without significant past medical history except recent episode of Advil overdose secondary to adjustment disorder, who was found to have leukocytosis on routine lab tests.  1. Leukocytosis with predominant neutrophilia -We discussed the differential of reactive neutrophilia versus primary bone marrow disease, such as myeloproliferative neoplasm -she  does not appear to have chronic infection, or inflammation. She is asymptomatic, recent CT chest was negative except a granuloma. -She is not on any herb supplement or steroids which can cause leukocytosis. -Her leukocytosis can certainly secondary to the stress when she had suicidal attempt with Advil overdose.  -I would like to ruled out myeloproliferative neoplasm, such as CML. -I'll repeat her CBC with differential today, BCR/ABL, JAK2 mutations -if a repeated WBC is normal, no further workup is needed. If she has persistent neutrophilia, I'll obtain a ultrasound of abdomen to valid liver and spleen, and consider bone marrow biopsy if BCR/ABL and JAK2 mutation negative.  2. Mild anemia -I'll check her reticular count, iron studies and ferritin, LDH and Haptoglobin  3. Hyperbilirubinemia -We'll check a direct bilirubin, ruled out hemolysis -Follow-up   Follow-up: I'll call her back with the above blood test  results. If abnormal blood test I'll see her back in 2 weeks to decide if she needs a bone marrow biopsy. If her lab test result normal, I'll see her back in 3 months with a repeated CBC with diff.  All questions were answered. The patient knows to call the clinic with any problems, questions or concerns. I spent 30 minutes counseling the patient face to face. The total time spent in the appointment was 40 minutes and more than 50% was on counseling.     Truitt Merle, MD 05/21/2014 12:02 PM

## 2014-05-21 NOTE — Telephone Encounter (Signed)
gv and printed appt sched and avs for pt for May..sent to lab

## 2014-05-22 LAB — HAPTOGLOBIN: Haptoglobin: 138 mg/dL (ref 43–212)

## 2014-05-22 LAB — SEDIMENTATION RATE: Sed Rate: 6 mm/hr (ref 0–20)

## 2014-05-22 LAB — BILIRUBIN, DIRECT: BILIRUBIN DIRECT: 0.2 mg/dL (ref 0.0–0.3)

## 2014-06-22 ENCOUNTER — Ambulatory Visit: Payer: Self-pay

## 2014-08-21 ENCOUNTER — Telehealth: Payer: Self-pay | Admitting: Hematology

## 2014-08-21 ENCOUNTER — Ambulatory Visit: Payer: Self-pay | Admitting: Hematology

## 2014-08-21 ENCOUNTER — Other Ambulatory Visit: Payer: Self-pay

## 2014-08-21 NOTE — Telephone Encounter (Signed)
pt cld to CX appt due to ins-stated did nt want to r/s at this time

## 2014-08-22 ENCOUNTER — Ambulatory Visit: Payer: Self-pay | Admitting: Hematology

## 2014-08-22 ENCOUNTER — Other Ambulatory Visit: Payer: Self-pay

## 2014-10-18 ENCOUNTER — Ambulatory Visit: Payer: BC Managed Care – PPO | Admitting: Gynecology

## 2014-11-21 ENCOUNTER — Encounter: Payer: Self-pay | Admitting: Pulmonary Disease

## 2014-11-21 ENCOUNTER — Ambulatory Visit (INDEPENDENT_AMBULATORY_CARE_PROVIDER_SITE_OTHER): Payer: 59 | Admitting: Pulmonary Disease

## 2014-11-21 VITALS — BP 120/70 | HR 91 | Temp 97.2°F | Resp 16 | Ht 60.0 in | Wt 117.0 lb

## 2014-11-21 DIAGNOSIS — Z9229 Personal history of other drug therapy: Secondary | ICD-10-CM | POA: Insufficient documentation

## 2014-11-21 DIAGNOSIS — R7611 Nonspecific reaction to tuberculin skin test without active tuberculosis: Secondary | ICD-10-CM | POA: Diagnosis not present

## 2014-11-21 DIAGNOSIS — J309 Allergic rhinitis, unspecified: Secondary | ICD-10-CM | POA: Insufficient documentation

## 2014-11-21 DIAGNOSIS — J8410 Pulmonary fibrosis, unspecified: Secondary | ICD-10-CM | POA: Diagnosis not present

## 2014-11-21 DIAGNOSIS — J984 Other disorders of lung: Secondary | ICD-10-CM

## 2014-11-21 DIAGNOSIS — Z789 Other specified health status: Secondary | ICD-10-CM

## 2014-11-21 DIAGNOSIS — J301 Allergic rhinitis due to pollen: Secondary | ICD-10-CM

## 2014-11-21 DIAGNOSIS — Z9289 Personal history of other medical treatment: Secondary | ICD-10-CM

## 2014-11-21 NOTE — Patient Instructions (Signed)
Neliah- it was nice meeting you today...  As we discussed you have a tiny granuloma on your left lung.  By the serial CXRs that we have available to review- this tiny spot has not changed in 10 yrs and the CT scan of your lungs was otherwise totally normal.  This granuloma is a sign of an old infection from years ago and should not give you any problem going forward...  For your allergies and intermittent cough>>    Take an OTC antihistamine like Claritin, Zyrtek, or Allegra...    Use a SALINE nasal mist every 1-2H as needed to keep your nasal passages open 7 clear...    Use the OTC FLONASE (Fluticasone) 2 sprays in each nostril at bedtime...    Finally you can use the OTC DELSYM cough syrup as needed- 2 tsp twice daily when needed...  Call for any questions or if I can be of service in any way.Marland KitchenMarland Kitchen

## 2014-11-21 NOTE — Progress Notes (Addendum)
Subjective:     Patient ID: Dana Myers, female   DOB: 12/27/1985, 29 y.o.   MRN: [id number]  HPI 29 y/o woman from [place], referred by [place], for a pulmonary evaluation due to a LUL lung nodule>      Dana Myers has enjoyed excellent general medical health, she is a never smoker, and has no prev hx of any respiratory illnesses;  She notes mild intermittent cough on occas, usually dry but occas prod of a small amt of whitish sput w/o discoloration & w/o blood;  She relates this to poss allergies/ occas sinus drainage/ assoc w/ nasal congestion, sneezing, and itching esp in the spring of the yr;  She denies SOB, wheezing, chest tightness, CP, etc...  I believe that she is applying for legal status in the [place] (she has been here since 2004) and recently saw an "immigration doctor" (Dr. Iona Beard Osei-Bonsu) who told her that she needed this pulmonary evaluation due to an abn CXR...  The films revealed a tiny 35mm LUL calcified granuloma, CT Chest shows no there lesions or abnormality, and old films in the PACS shows the same tiny nodule (unchanged) on an old film in 2007... 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   EXAM showed Afeb, VSS, O2sat=98% on RA;  HEENT- neg, mallampati2;  Chest- clear w/o w/r/r;  Heart- RR w/o m/r/g;  Abd- soft, neg;  Ext- neg w/o c/c/e;  Neuro- intact... Review in EPIC shows old films, recent CXR and CT Chest done by her [facility] doctors>  Old CXRs as far back as 09/06/05 showed norm heart size, clear lung except for tiny calcif granuloma in LUL (that has shown no change serially in 70yrs)  LABS 3/16 by DrFeng were all wnl...  CXR 04/30/14 showed tiny calcif granuloma in LUL, otherw clear, norm heart size, NAD- (NOTE- no change in the tiny calcif gran LUL compared to old films  back to 2007)...  CT Chest 05/14/14 showed 18mm calcif granulomain LUL, remainder of lungs clear, no adenopathy, NAD.Marland KitchenMarland Kitchen                       CXR 09/06/05                             Close up of LUL granuloma    IMP/PLAN>>  Dana Myers enjoys excellent general medical health;  She has an intermittent mild cough related to allergic rhinitis & we discussed treatment w/ OTC meds as needed (Antihist, Saline, Flonase, Delsym);  She has an asymtomatic tiny 34mm LUL granuloma which is unchanged over the last 10 yrs at least;  There is no active infection here & she is not now, nor has she ever been, contagious in any way;  She is reported to have 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     No past medical history on file.     She was Adm [facility]/16 to [facility] for an adjustment disorder w/ mixed anxiety & depression due to marital stress caused by family issue (no meds  required)...    She was referred to Heme- DrFeng, 3/16 due to a mild leukocytosis> she has no recurrent infections other than occas UTI, his evaql was neg & no further w/u indicated...   No past surgical history on file.    She has one daughter delvered ~60yrs ago   No outpatient encounter prescriptions on file as of 11/21/2014.   No facility-administered encounter medications on file as of 11/21/2014.  No regular medications taken   Allergies  Allergen Reactions  . Tinidazole Rash    Immunization History  Administered Date(s) Administered  . DTaP 05/13/2001, 06/15/2001, 07/17/2001, 08/17/2001, 09/16/2001  . Hepatitis B 04/29/2000, 05/28/2000, 06/30/2000  . IPV 03/24/2002  . MMR 02/20/2002, 09/25/2013  . Tdap 09/25/2013  She has an immunization record in her personal file that mentions prev childhood vaccinations including mention of BCG, but the dates and details are not included and  pt is not sure...   Family History  Problem Relation Age of Onset  . Heart disease Father   . Hypertension Father   . Throat cancer Maternal Grandfather   . Cancer Maternal Grandfather 19    thorat cancer   . Diabetes Maternal Uncle     Uncle's on mom's side  . Asthma Father     Social History   Social History  . Marital Status: Married >> her husb is from [place]    Spouse Name: N/A  . Number of Children: N/A  . Years of Education: N/A   Occupational History  . Not on file.>> she works for an [employer] firm   Social History Main Topics  . Smoking status: Never Smoker   . Smokeless tobacco: Never Used  . Alcohol Use: No  . Drug Use: No  . Sexual Activity:    Partners: Male    Birth Control/ Protection: Condom   Other Topics Concern  . Not on file   Social History Narrative    Current Medications, Allergies, Past Medical History, Past Surgical History, Family History, and Social History were reviewed in [employer] record.   Review of Systems            All symptoms NEG except where BOLDED >>  Constitutional:  F/C/S, fatigue, anorexia, unexpected weight change. HEENT:  HA, visual changes, hearing loss, earache, nasal symptoms, sore throat, mouth sores, hoarseness. Resp:  cough, sputum, hemoptysis; SOB, tightness, wheezing. Cardio:  CP, palpit, DOE, orthopnea, edema. GI:  N/V/D/C, blood in stool; reflux, abd pain, distention, gas. GU:  dysuria, freq, urgency, hematuria, flank pain, voiding difficulty. MS:  joint pain, swelling, tenderness, decr ROM; neck pain, back pain, etc. Neuro:  HA, tremors, seizures, dizziness, syncope, weakness, numbness, gait abn. Skin:  suspicious lesions or skin rash. Heme:  adenopathy, bruising, bleeding. Psyche:  confusion, agitation, sleep disturbance, hallucinations, anxiety, depression suicidal.   Objective:   Physical Exam      Vital Signs:  Reviewed...  General:  WD, WN, 29 y/o woman from [place] in  [place]; alert & oriented; pleasant & cooperative... HEENT:  [place]/AT; Conjunctiva- pink, Sclera- nonicteric, EOM-wnl, PERRLA, EACs-clear, TMs-wnl; NOSE-clear; THROAT-clear & wnl. Neck:  Supple w/ full ROM; no JVD; normal carotid impulses w/o bruits; no thyromegaly or nodules palpated; no lymphadenopathy. Chest:  Clear to P & A; without wheezes, rales, or rhonchi heard. Heart:  Regular Rhythm; norm S1 & S2 without murmurs, rubs, or gallops detected. Abdomen:  Soft & nontender- no guarding or rebound; normal bowel sounds; no organomegaly or masses palpated. Ext:  Normal ROM; without deformities or arthritic changes; no varicose veins, venous insuffic, or edema;  Pulses intact w/o bruits. Neuro:  CNs II-XII intact; motor testing normal; sensory testing normal; gait normal & balance OK. Derm:  No lesions noted; no rash etc. Lymph:  No cervical, supraclavicular, axillary, or inguinal adenopathy palpated.   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    Allergic Rhinitis -- we discussed prn OTC meds incluing:  Antihist, Saline, Flonase, Delsym...    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    Plan:      Patient's Medications   No medications on file

## 2014-11-22 ENCOUNTER — Encounter: Payer: Self-pay | Admitting: Pulmonary Disease

## 2015-08-01 IMAGING — CR DG CHEST 1V
1 series · 1 of 1 positions shown · non-contrast
Comparison: Chest radiograph August 17, 2005.

CLINICAL DATA: Assess for tuberculosis.

EXAM:
CHEST - 1 VIEW

[PA]
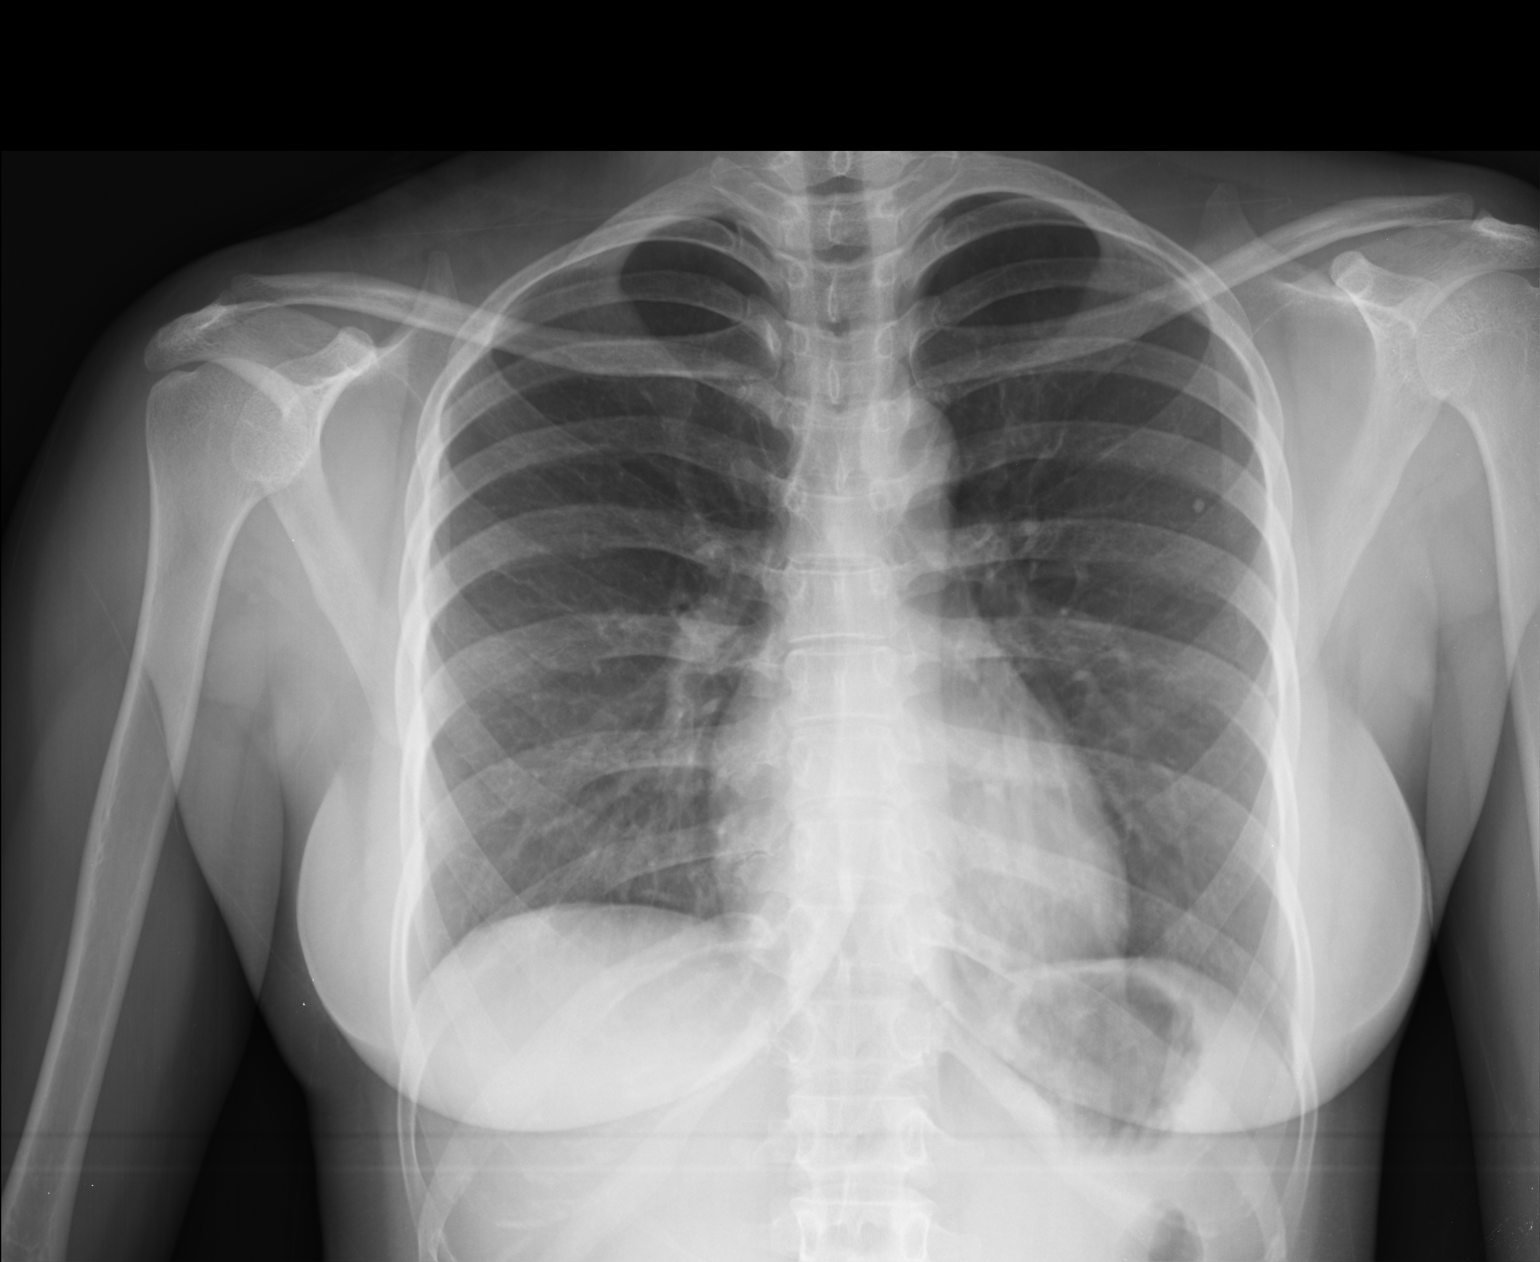

[1 of 1 positions shown; findings below may reference images not displayed]

FINDINGS: Cardiomediastinal silhouette is unremarkable. Stable left upper lobe
granuloma. The lungs are clear without pleural effusions or focal
consolidations. Trachea projects midline and there is no
pneumothorax. Soft tissue planes and included osseous structures are
non-suspicious.
IMPRESSION: Stable left upper lobe granuloma without acute cardiopulmonary
process.

  By: Giorgi Jumper

## 2016-03-06 IMAGING — CR DG CHEST 2V
1 series · 1 of 1 positions shown · non-contrast
Comparison: 09/25/2013

CLINICAL DATA: Medical clearance for pre admission

EXAM:
CHEST  2 VIEW

[view not recorded]
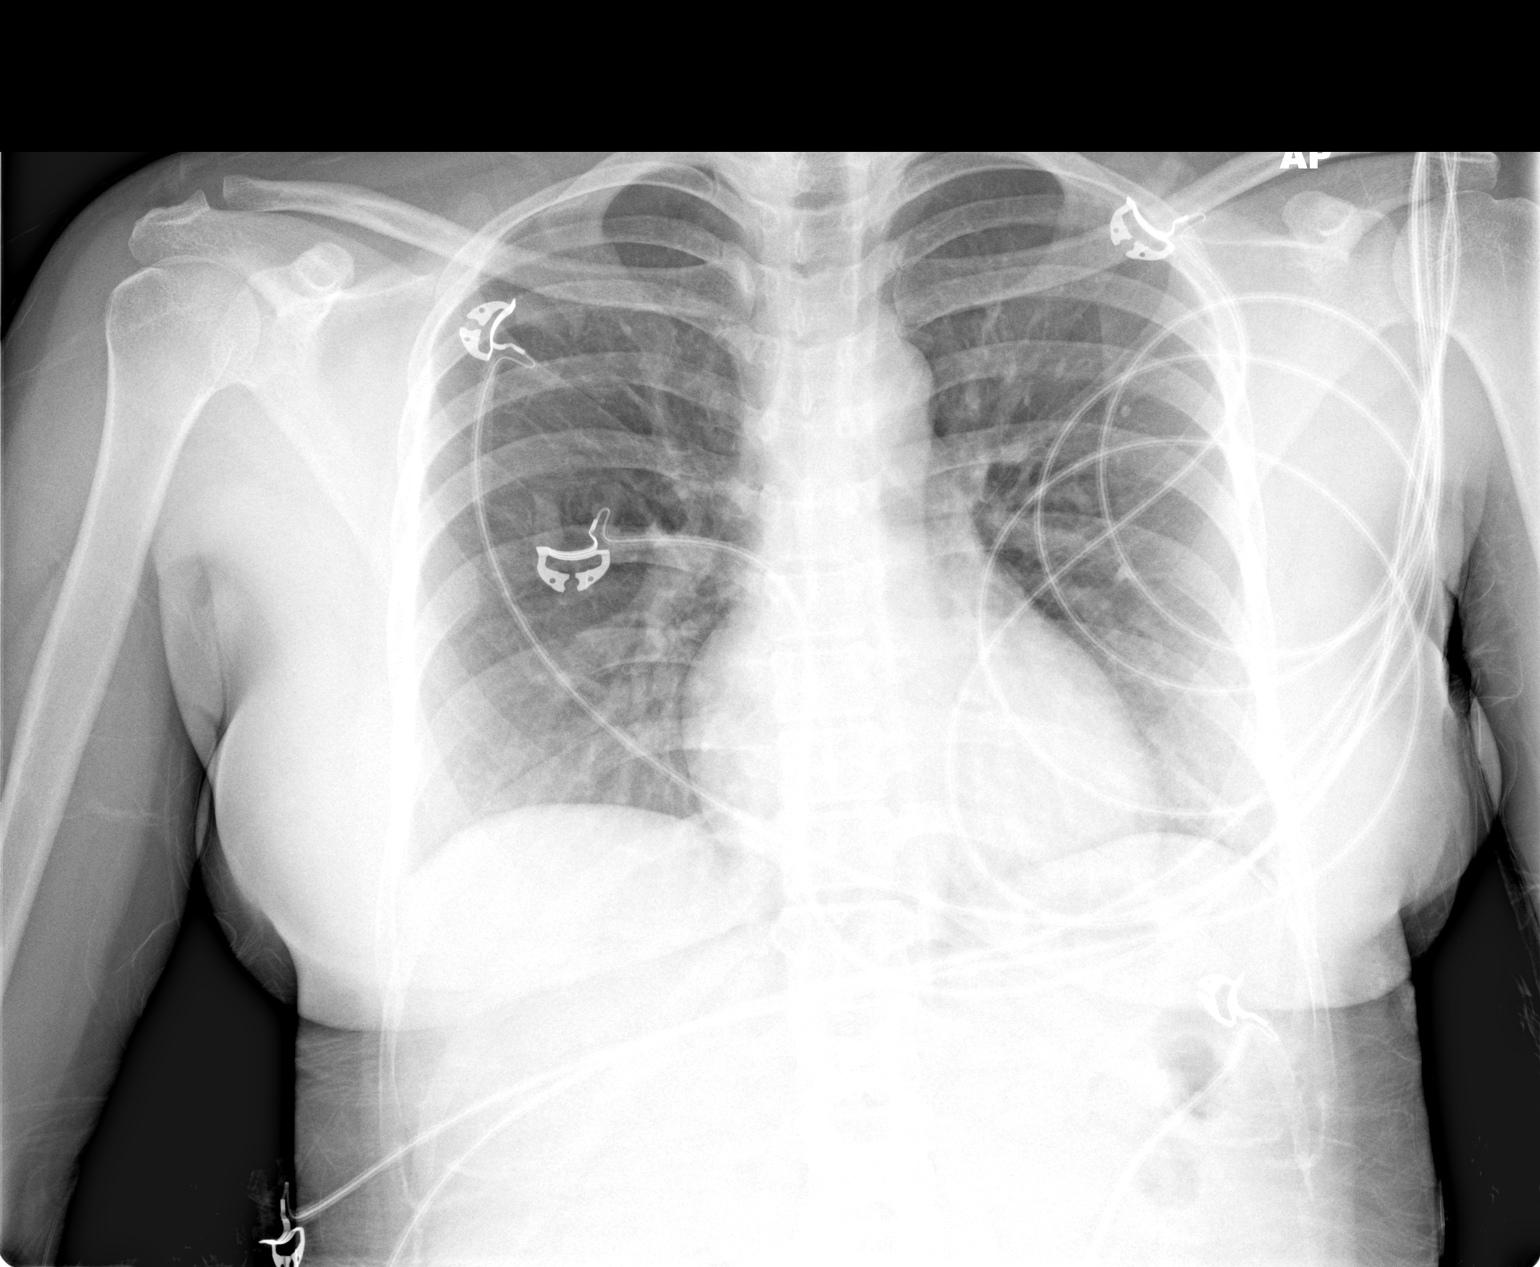

[1 of 1 positions shown; findings below may reference images not displayed]

FINDINGS: The heart size and mediastinal contours are within normal limits.
Both lungs are clear except for an unchanged left upper lobe
granuloma. The visualized skeletal structures are unremarkable.
IMPRESSION: No active cardiopulmonary disease.
